# Patient Record
Sex: Female | Born: 1970 | Race: White | Hispanic: No | Smoking: Never smoker
Health system: Southern US, Community
[De-identification: ages and names within clinical notes are randomized; demographics above are authoritative.]

## PROBLEM LIST (undated history)

## (undated) DIAGNOSIS — J189 Pneumonia, unspecified organism: Secondary | ICD-10-CM

## (undated) DIAGNOSIS — Z8709 Personal history of other diseases of the respiratory system: Secondary | ICD-10-CM

## (undated) DIAGNOSIS — T8859XA Other complications of anesthesia, initial encounter: Secondary | ICD-10-CM

## (undated) DIAGNOSIS — D219 Benign neoplasm of connective and other soft tissue, unspecified: Secondary | ICD-10-CM

## (undated) DIAGNOSIS — C801 Malignant (primary) neoplasm, unspecified: Secondary | ICD-10-CM

## (undated) HISTORY — DX: Malignant (primary) neoplasm, unspecified: C80.1

## (undated) HISTORY — PX: WISDOM TOOTH EXTRACTION: SHX21

## (undated) HISTORY — DX: Other complications of anesthesia, initial encounter: T88.59XA

## (undated) HISTORY — PX: OTHER SURGICAL HISTORY: SHX169

---

## 2001-02-06 ENCOUNTER — Encounter: Payer: Self-pay | Admitting: Obstetrics and Gynecology

## 2001-02-06 ENCOUNTER — Ambulatory Visit (HOSPITAL_COMMUNITY): Admission: RE | Admit: 2001-02-06 | Discharge: 2001-02-06 | Payer: Self-pay | Admitting: Obstetrics and Gynecology

## 2001-09-23 ENCOUNTER — Inpatient Hospital Stay (HOSPITAL_COMMUNITY): Admission: AD | Admit: 2001-09-23 | Discharge: 2001-09-26 | Payer: Self-pay | Admitting: Obstetrics and Gynecology

## 2001-10-03 ENCOUNTER — Encounter: Admission: RE | Admit: 2001-10-03 | Discharge: 2001-11-02 | Payer: Self-pay | Admitting: Obstetrics and Gynecology

## 2002-01-22 ENCOUNTER — Other Ambulatory Visit: Admission: RE | Admit: 2002-01-22 | Discharge: 2002-01-22 | Payer: Self-pay | Admitting: Obstetrics and Gynecology

## 2003-07-30 ENCOUNTER — Other Ambulatory Visit: Admission: RE | Admit: 2003-07-30 | Discharge: 2003-07-30 | Payer: Self-pay | Admitting: Obstetrics and Gynecology

## 2004-07-17 ENCOUNTER — Inpatient Hospital Stay (HOSPITAL_COMMUNITY): Admission: RE | Admit: 2004-07-17 | Discharge: 2004-07-21 | Payer: Self-pay | Admitting: Obstetrics and Gynecology

## 2004-07-26 ENCOUNTER — Encounter: Admission: RE | Admit: 2004-07-26 | Discharge: 2004-08-25 | Payer: Self-pay | Admitting: Obstetrics and Gynecology

## 2004-08-26 ENCOUNTER — Encounter: Admission: RE | Admit: 2004-08-26 | Discharge: 2004-09-25 | Payer: Self-pay | Admitting: Obstetrics and Gynecology

## 2004-09-08 ENCOUNTER — Other Ambulatory Visit: Admission: RE | Admit: 2004-09-08 | Discharge: 2004-09-08 | Payer: Self-pay | Admitting: Obstetrics and Gynecology

## 2004-09-26 ENCOUNTER — Encounter: Admission: RE | Admit: 2004-09-26 | Discharge: 2004-10-25 | Payer: Self-pay | Admitting: Obstetrics and Gynecology

## 2004-10-26 ENCOUNTER — Encounter: Admission: RE | Admit: 2004-10-26 | Discharge: 2004-11-25 | Payer: Self-pay | Admitting: Obstetrics and Gynecology

## 2004-11-26 ENCOUNTER — Encounter: Admission: RE | Admit: 2004-11-26 | Discharge: 2004-12-25 | Payer: Self-pay | Admitting: Obstetrics and Gynecology

## 2004-12-26 ENCOUNTER — Encounter: Admission: RE | Admit: 2004-12-26 | Discharge: 2005-01-25 | Payer: Self-pay | Admitting: Obstetrics and Gynecology

## 2010-04-26 ENCOUNTER — Other Ambulatory Visit (HOSPITAL_COMMUNITY)
Admission: RE | Admit: 2010-04-26 | Discharge: 2010-04-26 | Disposition: A | Discharge status: Home / Self Care | Payer: BC Managed Care – PPO | Source: Ambulatory Visit | Attending: Obstetrics and Gynecology | Admitting: Obstetrics and Gynecology

## 2010-04-26 ENCOUNTER — Other Ambulatory Visit: Payer: Self-pay | Admitting: Obstetrics and Gynecology

## 2010-04-26 ENCOUNTER — Other Ambulatory Visit: Payer: Self-pay | Admitting: Nurse Practitioner

## 2010-04-26 DIAGNOSIS — N644 Mastodynia: Secondary | ICD-10-CM

## 2010-04-26 DIAGNOSIS — Z1159 Encounter for screening for other viral diseases: Secondary | ICD-10-CM | POA: Insufficient documentation

## 2010-04-26 DIAGNOSIS — Z01419 Encounter for gynecological examination (general) (routine) without abnormal findings: Secondary | ICD-10-CM | POA: Insufficient documentation

## 2011-05-01 ENCOUNTER — Encounter: Payer: BC Managed Care – PPO | Admitting: Vascular Surgery

## 2011-07-23 ENCOUNTER — Other Ambulatory Visit: Payer: Self-pay | Admitting: Obstetrics and Gynecology

## 2011-07-23 ENCOUNTER — Other Ambulatory Visit (HOSPITAL_COMMUNITY)
Admission: RE | Admit: 2011-07-23 | Discharge: 2011-07-23 | Disposition: A | Discharge status: Home / Self Care | Payer: No Typology Code available for payment source | Source: Ambulatory Visit | Attending: Obstetrics and Gynecology | Admitting: Obstetrics and Gynecology

## 2011-07-23 DIAGNOSIS — Z01419 Encounter for gynecological examination (general) (routine) without abnormal findings: Secondary | ICD-10-CM | POA: Insufficient documentation

## 2012-12-16 ENCOUNTER — Other Ambulatory Visit: Payer: Self-pay

## 2012-12-16 DIAGNOSIS — Z1231 Encounter for screening mammogram for malignant neoplasm of breast: Secondary | ICD-10-CM

## 2013-01-23 ENCOUNTER — Ambulatory Visit: Payer: No Typology Code available for payment source

## 2013-02-20 ENCOUNTER — Ambulatory Visit: Payer: No Typology Code available for payment source

## 2013-10-30 ENCOUNTER — Ambulatory Visit: Payer: No Typology Code available for payment source

## 2013-11-13 ENCOUNTER — Ambulatory Visit
Admission: RE | Admit: 2013-11-13 | Discharge: 2013-11-13 | Disposition: A | Discharge status: Home / Self Care | Payer: BC Managed Care – PPO | Source: Ambulatory Visit

## 2013-11-13 DIAGNOSIS — Z1231 Encounter for screening mammogram for malignant neoplasm of breast: Secondary | ICD-10-CM

## 2014-04-01 ENCOUNTER — Other Ambulatory Visit: Payer: Self-pay

## 2014-05-25 ENCOUNTER — Other Ambulatory Visit: Payer: Self-pay | Admitting: Nurse Practitioner

## 2014-05-25 ENCOUNTER — Other Ambulatory Visit (HOSPITAL_COMMUNITY)
Admission: RE | Admit: 2014-05-25 | Discharge: 2014-05-25 | Disposition: A | Discharge status: Home / Self Care | Payer: BLUE CROSS/BLUE SHIELD | Source: Ambulatory Visit | Attending: Nurse Practitioner | Admitting: Nurse Practitioner

## 2014-05-25 DIAGNOSIS — Z1151 Encounter for screening for human papillomavirus (HPV): Secondary | ICD-10-CM | POA: Insufficient documentation

## 2014-05-25 DIAGNOSIS — Z01419 Encounter for gynecological examination (general) (routine) without abnormal findings: Secondary | ICD-10-CM | POA: Diagnosis present

## 2014-05-26 LAB — CYTOLOGY - PAP

## 2014-11-19 ENCOUNTER — Other Ambulatory Visit: Payer: Self-pay

## 2014-11-19 DIAGNOSIS — Z1231 Encounter for screening mammogram for malignant neoplasm of breast: Secondary | ICD-10-CM

## 2014-12-24 ENCOUNTER — Ambulatory Visit: Payer: BLUE CROSS/BLUE SHIELD

## 2015-01-21 ENCOUNTER — Ambulatory Visit
Admission: RE | Admit: 2015-01-21 | Discharge: 2015-01-21 | Disposition: A | Discharge status: Home / Self Care | Payer: BLUE CROSS/BLUE SHIELD | Source: Ambulatory Visit

## 2015-01-21 DIAGNOSIS — Z1231 Encounter for screening mammogram for malignant neoplasm of breast: Secondary | ICD-10-CM

## 2016-03-26 ENCOUNTER — Other Ambulatory Visit: Payer: Self-pay | Admitting: Obstetrics and Gynecology

## 2016-03-26 DIAGNOSIS — Z1231 Encounter for screening mammogram for malignant neoplasm of breast: Secondary | ICD-10-CM

## 2016-04-27 ENCOUNTER — Other Ambulatory Visit: Payer: Self-pay | Admitting: Obstetrics and Gynecology

## 2016-04-27 ENCOUNTER — Ambulatory Visit
Admission: RE | Admit: 2016-04-27 | Discharge: 2016-04-27 | Disposition: A | Discharge status: Home / Self Care | Payer: BLUE CROSS/BLUE SHIELD | Source: Ambulatory Visit | Attending: Obstetrics and Gynecology | Admitting: Obstetrics and Gynecology

## 2016-04-27 DIAGNOSIS — Z1231 Encounter for screening mammogram for malignant neoplasm of breast: Secondary | ICD-10-CM

## 2017-09-27 ENCOUNTER — Other Ambulatory Visit: Payer: Self-pay | Admitting: Obstetrics and Gynecology

## 2017-09-27 ENCOUNTER — Other Ambulatory Visit (HOSPITAL_COMMUNITY)
Admission: RE | Admit: 2017-09-27 | Discharge: 2017-09-27 | Disposition: A | Discharge status: Home / Self Care | Payer: 59 | Source: Ambulatory Visit | Attending: Obstetrics and Gynecology | Admitting: Obstetrics and Gynecology

## 2017-09-27 DIAGNOSIS — Z01411 Encounter for gynecological examination (general) (routine) with abnormal findings: Secondary | ICD-10-CM | POA: Diagnosis present

## 2017-10-01 LAB — CYTOLOGY - PAP
Diagnosis: NEGATIVE
HPV: NOT DETECTED

## 2019-02-04 ENCOUNTER — Other Ambulatory Visit: Payer: Self-pay | Admitting: Obstetrics and Gynecology

## 2019-02-04 DIAGNOSIS — Z1231 Encounter for screening mammogram for malignant neoplasm of breast: Secondary | ICD-10-CM

## 2019-03-12 ENCOUNTER — Ambulatory Visit: Payer: 59

## 2019-04-13 ENCOUNTER — Ambulatory Visit: Payer: 59

## 2019-04-24 ENCOUNTER — Ambulatory Visit: Payer: 59

## 2019-04-27 ENCOUNTER — Other Ambulatory Visit: Payer: Self-pay

## 2019-04-27 ENCOUNTER — Ambulatory Visit
Admission: RE | Admit: 2019-04-27 | Discharge: 2019-04-27 | Disposition: A | Discharge status: Home / Self Care | Payer: 59 | Source: Ambulatory Visit | Attending: Obstetrics and Gynecology | Admitting: Obstetrics and Gynecology

## 2019-04-27 DIAGNOSIS — Z1231 Encounter for screening mammogram for malignant neoplasm of breast: Secondary | ICD-10-CM

## 2020-01-09 DIAGNOSIS — Z8616 Personal history of COVID-19: Secondary | ICD-10-CM

## 2020-01-09 HISTORY — DX: Personal history of COVID-19: Z86.16

## 2020-03-22 ENCOUNTER — Ambulatory Visit (HOSPITAL_BASED_OUTPATIENT_CLINIC_OR_DEPARTMENT_OTHER): Admission: RE | Admit: 2020-03-22 | Payer: 59 | Source: Home / Self Care | Admitting: Obstetrics and Gynecology

## 2020-03-22 ENCOUNTER — Encounter (HOSPITAL_BASED_OUTPATIENT_CLINIC_OR_DEPARTMENT_OTHER): Admission: RE | Payer: Self-pay | Source: Home / Self Care

## 2020-03-22 SURGERY — XI ROBOTIC ASSISTED LAPAROSCOPIC HYSTERECTOMY AND SALPINGECTOMY
Anesthesia: Choice | Laterality: Bilateral

## 2020-11-14 ENCOUNTER — Encounter (HOSPITAL_BASED_OUTPATIENT_CLINIC_OR_DEPARTMENT_OTHER): Payer: Self-pay | Admitting: Obstetrics and Gynecology

## 2020-11-14 ENCOUNTER — Other Ambulatory Visit: Payer: Self-pay

## 2020-11-14 NOTE — Progress Notes (Signed)
Spoke w/ via phone for pre-op interview---pt  Lab needs dos----  urine poct             Lab results------surgery orders req 11-10-2020 with kelly COVID test -----11-18-2020 extended recovery Arrive at -------530. Am 11-22-2020 NPO after MN NO Solid Food.  Clear liquids from MN until---430 am Med rec completed Medications to take morning of surgery -----none Diabetic medication -----n/a Patient instructed no nail polish to be worn day of surgery Patient instructed to bring photo id and insurance card day of surgery Patient aware to have Driver (ride ) / caregiver    for 24 hours after surgery  daughter grace age 78 Patient Special Instructions -----pt given extended recovery instructions Pre-Op special Istructions -----none Patient verbalized understanding of instructions that were given at this phone interview. Patient denies shortness of breath, chest pain, fever, cough at this phone interview.

## 2020-11-14 NOTE — Progress Notes (Signed)
YOU ARE SCHEDULED FOR A COVID TEST ON    11-18-2020 . THIS TEST MUST BE DONE BEFORE SURGERY. GO TO  [facility] @ [place]   PHONE NUMBER [phone].   TURN LEFT AT THE SHIPPING AND RECEIVING SIGN AND LOOK FOR SMALL BLUE POP UP TENT UNDER BUILDING OVERHANG AT BACK OF BUILDING AND REMAIN IN YOUR CAR, THIS IS A DRIVE UP TEST.  AFTER YOUR COVID TEST , PLEASE WEAR A MASK OUT IN PUBLIC AND SOCIAL DISTANCE AND [place] YOUR HANDS FREQUENTLY. PLEASE ASK ALL YOUR CLOSE HOUSEHOLD CONTACT TO WEAR MASK OUT IN PUBLIC AND SOCIAL DISTANCE AND [place] HANDS FREQUENTLY ALSO.      Your procedure is scheduled on 11-22-2020  Report to [facility].   Call this number if you have problems the morning of surgery  [phone].   OUR ADDRESS IS [place].  WE ARE LOCATED IN [facility].  PLEASE BRING YOUR INSURANCE CARD AND PHOTO ID DAY OF SURGERY.  ONLY ONE PERSON ALLOWED IN FACILITY WAITING AREA.                                     REMEMBER:  DO NOT EAT FOOD, CANDY GUM OR MINTS  AFTER MIDNIGHT . CLEAR LIQUIDS FROM MIDNIGHT UNTIL 430 AM. NO CLEAR LIQUIDS AFTER 430 AM DAY OF SURGERY.   YOU MAY  BRUSH YOUR TEETH MORNING OF SURGERY AND RINSE YOUR MOUTH OUT, NO CHEWING GUM CANDY OR MINTS.    CLEAR LIQUID DIET   Foods Allowed                                                                     Foods Excluded  Coffee and tea, regular and decaf                             liquids that you cannot  Plain Jell-O any favor except red or purple                                           see through such as: Fruit ices (not with fruit pulp)                                     milk, soups, orange juice  Iced Popsicles                                    All solid food Carbonated beverages, regular and diet                                    Cranberry, grape and apple juices Sports drinks like Gatorade Lightly seasoned clear  broth or consume(fat  free) Sugar  Sample Menu Breakfast                                Lunch                                     Supper Cranberry juice                    Beef broth                            Chicken broth Jell-O                                     Grape juice                           Apple juice Coffee or tea                        Jell-O                                      Popsicle                                                Coffee or tea                        Coffee or tea  _____________________________________________________________________     TAKE THESE MEDICATIONS MORNING OF SURGERY WITH A SIP OF WATER:  NONE  ONE VISITOR IS ALLOWED IN WAITING ROOM ONLY DAY OF SURGERY.  YOU MAY HAVE ANOTHER PERSON SWITCH OUT WITH THE  1  VISITOR IN THE WAITING ROOM DAY OF SURGERY AND A MASK MUST BE WORN IN THE WAITING ROOM.    2 VISITORS  MAY VISIT IN THE EXTENDED RECOVERY ROOM UNTIL 800 PM ONLY 1 VISITOR AGE 22 AND OVER MAY SPEND THE NIGHT AND MUST BE IN EXTENDED RECOVERY ROOM NO LATER THAN 800 PM .   UP TO 2 CHILDREN AGE 98 TO 15 MAY ALSO VISIT IN EXTENDED RECOVERY ROOM ONLY UNTIL 800 PM AND MUST LEAVE BY 800 PM. ALL PERSONS VISITING IN EXTENDED RECOVERY ROOM MUST WEAR A MASK.                                    DO NOT WEAR JEWERLY, MAKE UP. DO NOT WEAR LOTIONS, POWDERS, PERFUMES OR NAIL POLISH ON YOUR FINGERNAILS. TOENAIL POLISH IS OK TO WEAR. DO NOT SHAVE FOR 48 HOURS PRIOR TO DAY OF SURGERY. MEN MAY SHAVE FACE AND NECK. CONTACTS, GLASSES, OR DENTURES MAY NOT BE WORN TO SURGERY.                                    [facility] IS NOT  RESPONSIBLE  FOR ANY BELONGINGS.                                                                    Marland Kitchen           [place] - Preparing for Surgery Before surgery, you can play an important role.  Because skin is not sterile, your skin needs to be as free of germs as possible.  You can reduce the number of germs on your skin by washing with  CHG (chlorahexidine gluconate) soap before surgery.  CHG is an antiseptic cleaner which kills germs and bonds with the skin to continue killing germs even after washing. Please DO NOT use if you have an allergy to CHG or antibacterial soaps.  If your skin becomes reddened/irritated stop using the CHG and inform your nurse when you arrive at Short Stay. Do not shave (including legs and underarms) for at least 48 hours prior to the first CHG shower.  You may shave your face/neck. Please follow these instructions carefully:  1.  Shower with CHG Soap the night before surgery and the  morning of Surgery.  2.  If you choose to wash your hair, wash your hair first as usual with your  normal  shampoo.  3.  After you shampoo, rinse your hair and body thoroughly to remove the  shampoo.                            4.  Use CHG as you would any other liquid soap.  You can apply chg directly  to the skin and wash                      Gently with a scrungie or clean washcloth.  5.  Apply the CHG Soap to your body ONLY FROM THE NECK DOWN.   Do not use on face/ open                           Wound or open sores. Avoid contact with eyes, ears mouth and genitals (private parts).                       Wash face,  Genitals (private parts) with your normal soap.             6.  Wash thoroughly, paying special attention to the area where your surgery  will be performed.  7.  Thoroughly rinse your body with warm water from the neck down.  8.  DO NOT shower/wash with your normal soap after using and rinsing off  the CHG Soap.                9.  Pat yourself dry with a clean towel.            10.  Wear clean pajamas.            11.  Place clean sheets on your bed the night of your first shower and do not  sleep with pets. Day of Surgery : Do not apply any lotions/deodorants the morning of surgery.  Please wear clean clothes  to the hospital/surgery center.  IF YOU HAVE ANY SKIN IRRITATION OR PROBLEMS WITH THE SURGICAL  SOAP, PLEASE GET A BAR OF GOLD DIAL SOAP AND SHOWER THE NIGHT BEFORE YOUR SURGERY AND THE MORNING OF YOUR SURGERY. PLEASE LET THE NURSE KNOW MORNING OF YOUR SURGERY IF YOU HAD ANY PROBLEMS WITH THE SURGICAL SOAP.  FAILURE TO FOLLOW THESE INSTRUCTIONS MAY RESULT IN THE CANCELLATION OF YOUR SURGERY PATIENT SIGNATURE_________________________________  NURSE SIGNATURE__________________________________  ________________________________________________________________________                                                        QUESTIONS Hansel Feinstein PRE OP NURSE PHONE [phone].

## 2020-11-18 ENCOUNTER — Encounter (HOSPITAL_COMMUNITY): Admission: RE | Admit: 2020-11-18 | Payer: 59 | Source: Ambulatory Visit

## 2020-11-22 ENCOUNTER — Ambulatory Visit (HOSPITAL_BASED_OUTPATIENT_CLINIC_OR_DEPARTMENT_OTHER)
Admission: RE | Admit: 2020-11-22 | Payer: Medicaid Other | Source: Ambulatory Visit | Admitting: Obstetrics and Gynecology

## 2020-11-22 DIAGNOSIS — Z01818 Encounter for other preprocedural examination: Secondary | ICD-10-CM

## 2020-11-22 HISTORY — DX: Benign neoplasm of connective and other soft tissue, unspecified: D21.9

## 2020-11-22 SURGERY — XI ROBOTIC ASSISTED LAPAROSCOPIC HYSTERECTOMY AND SALPINGECTOMY
Anesthesia: Choice | Laterality: Bilateral

## 2021-09-20 IMAGING — MG DIGITAL SCREENING BILAT W/ TOMO W/ CAD
8 series · 9 of 24 positions shown · non-contrast
Comparison: Previous exam(s).

CLINICAL DATA: Screening.

EXAM:
DIGITAL SCREENING BILATERAL MAMMOGRAM WITH TOMO AND CAD

[L MLO synth-2D]
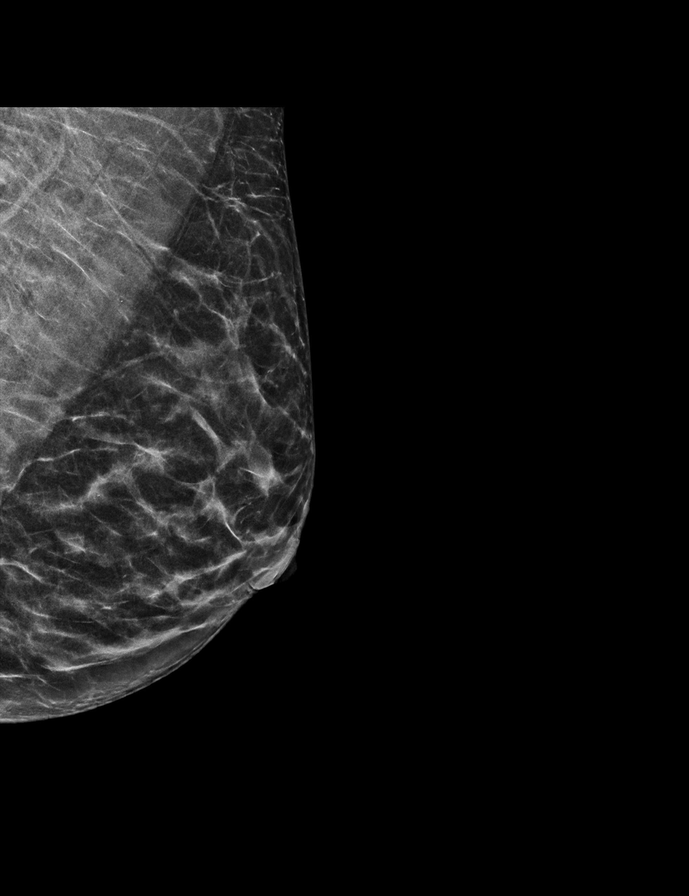

[R MLO synth-2D]
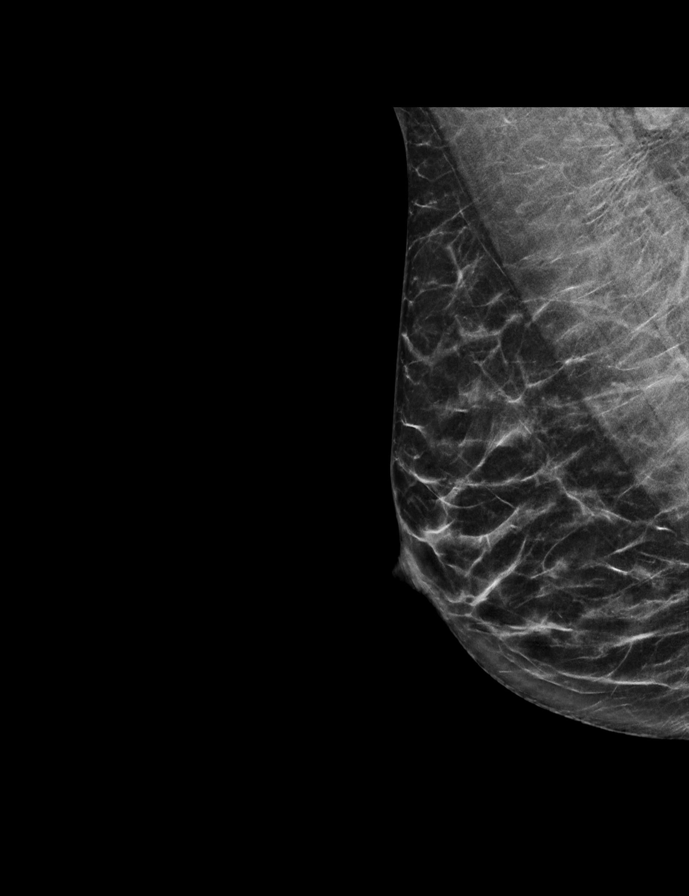

[L CC synth-2D]
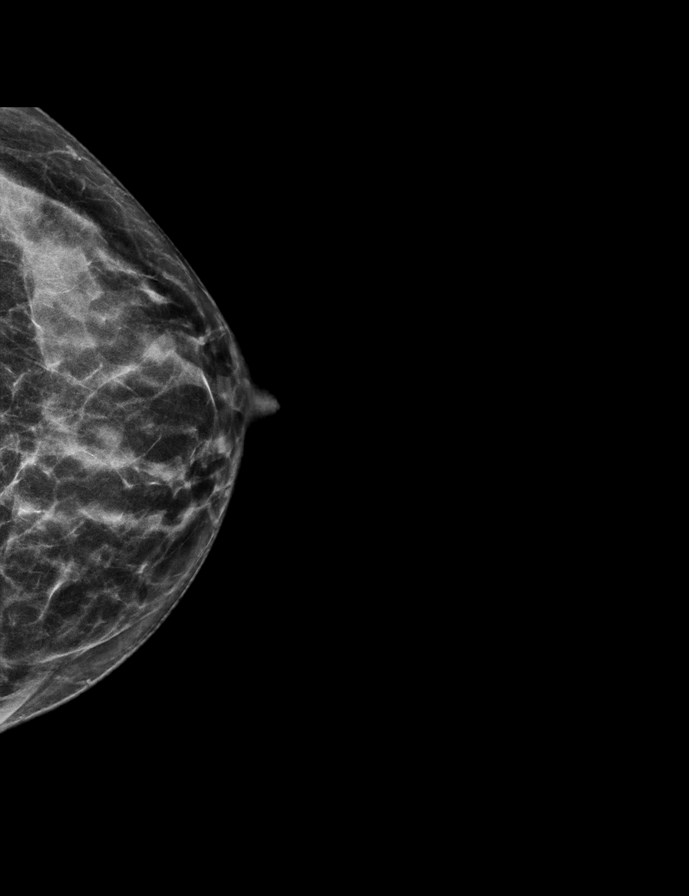

[R CC synth-2D]
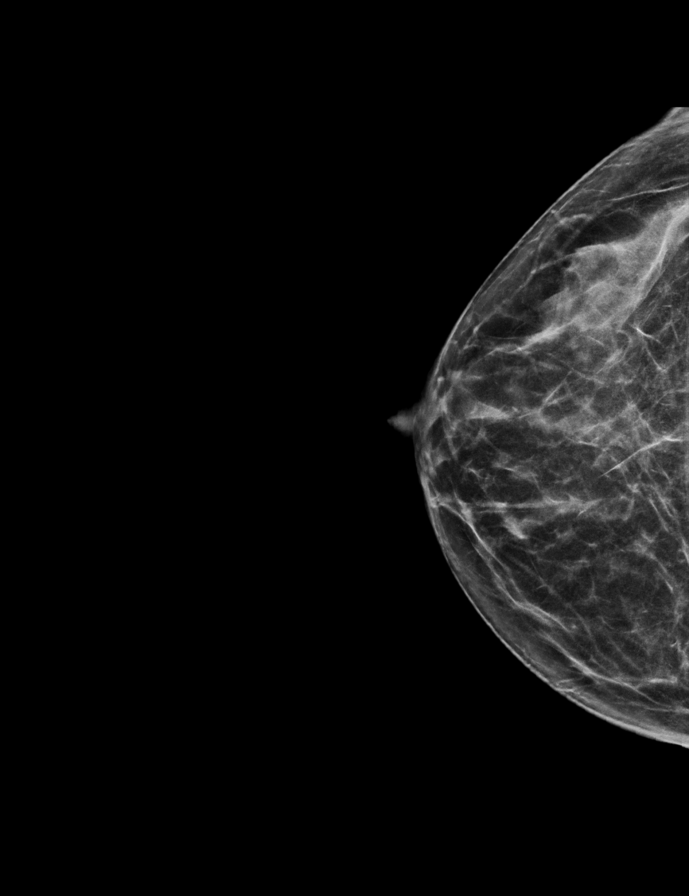

[R MLO tomo · 2 of 52 frames shown]
[frame 17/52]
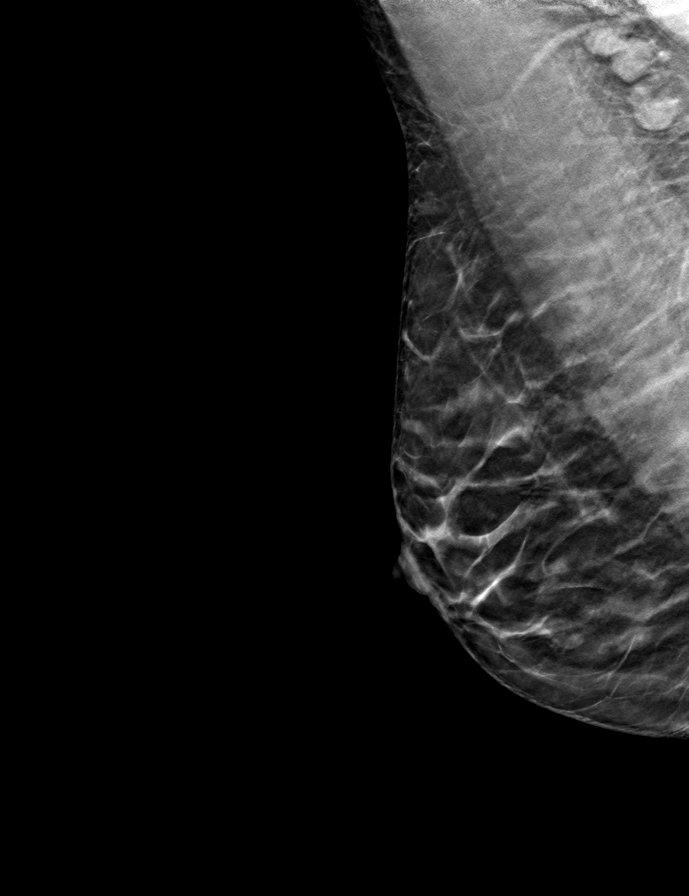
[frame 27/52]
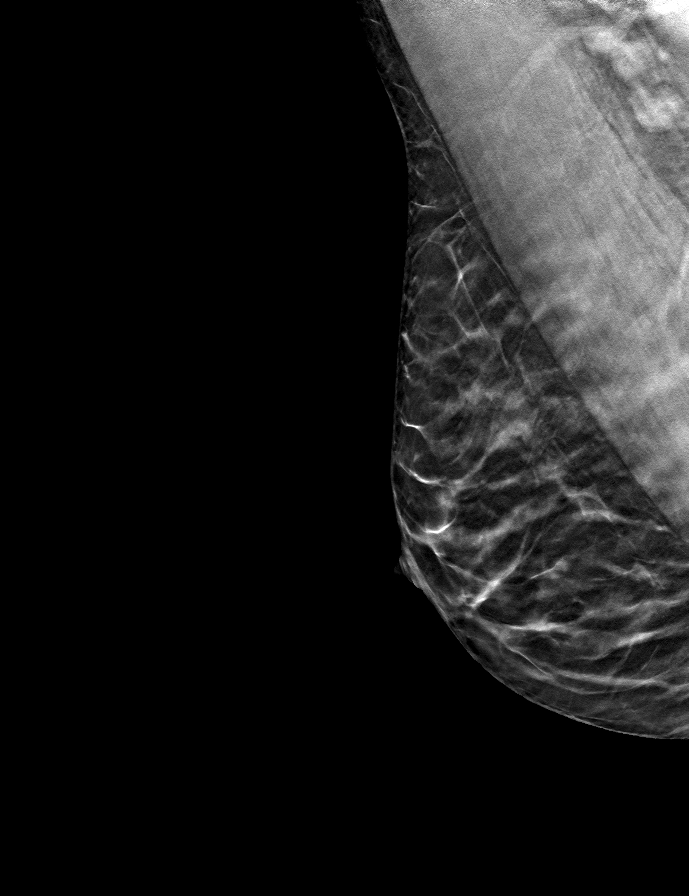

[L CC tomo · tomo slice 27/54.0]
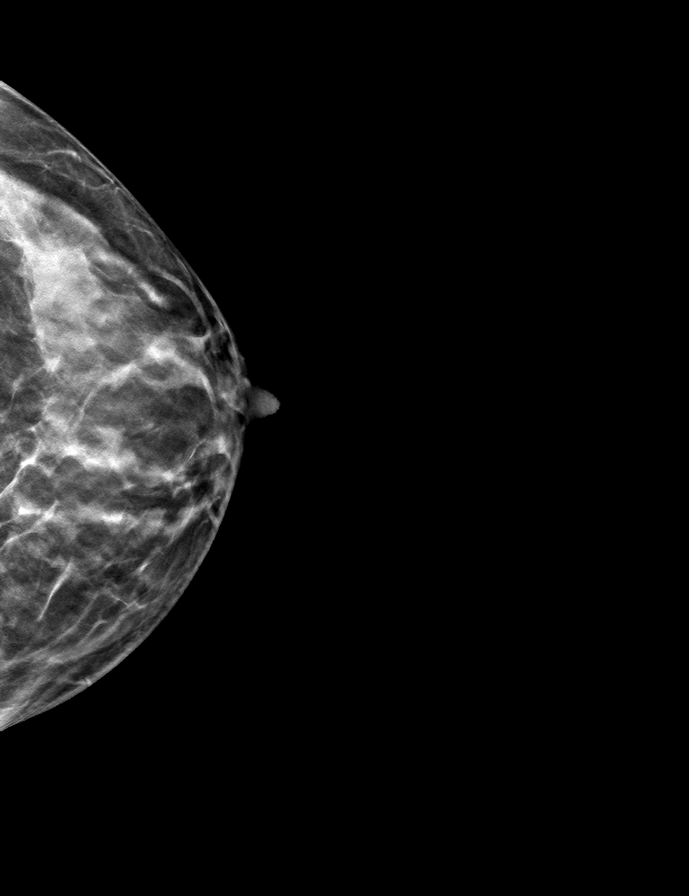

[L MLO tomo · tomo slice 26/51.0]
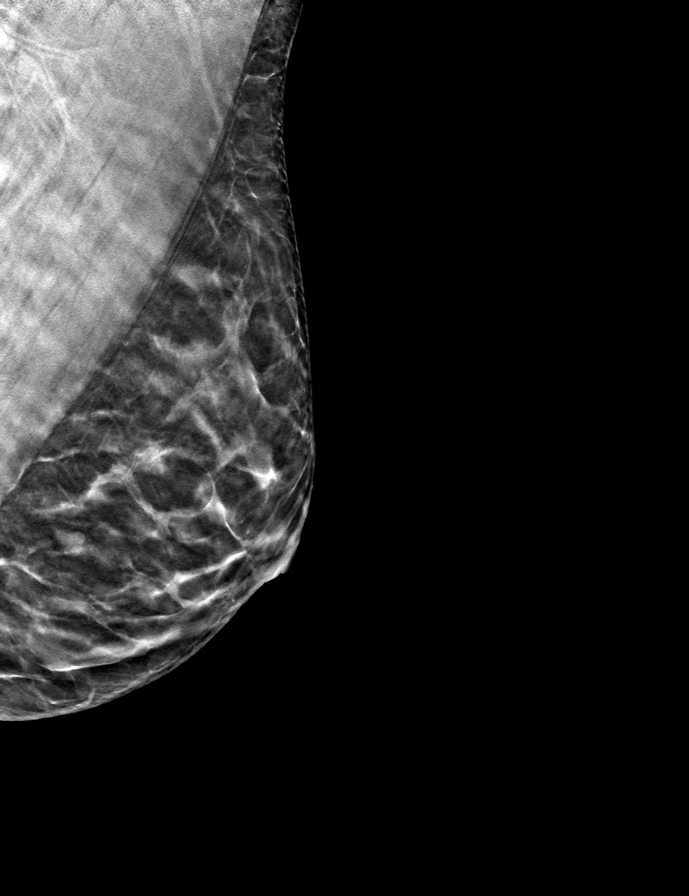

[R CC tomo · tomo slice 25/50.0]
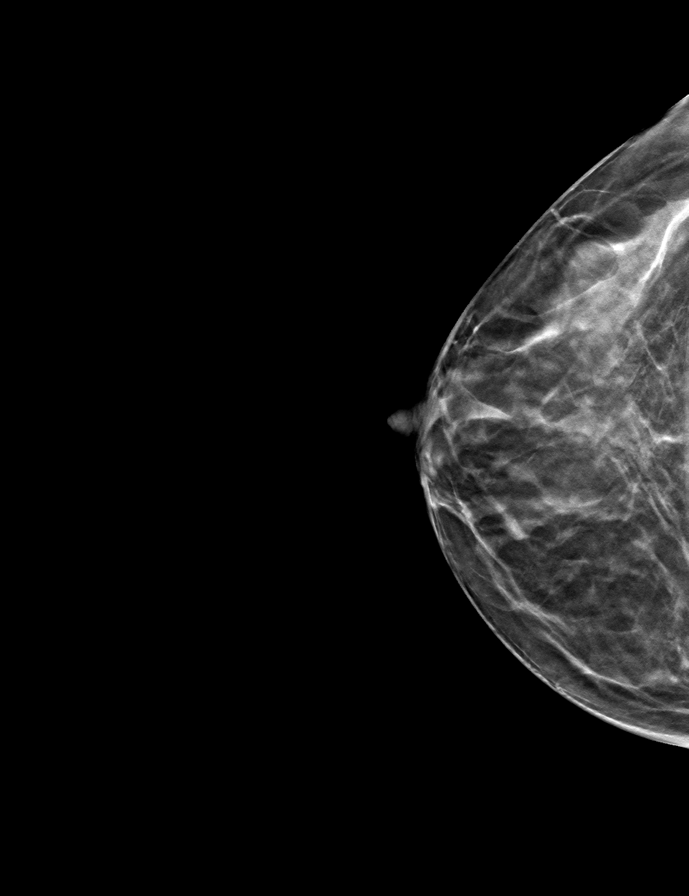

[9 of 24 positions shown; findings below may reference images not displayed]

ACR Breast Density Category c: The breast tissue is heterogeneously
dense, which may obscure small masses.
FINDINGS: There are no findings suspicious for malignancy. Images were
processed with CAD.
IMPRESSION: No mammographic evidence of malignancy. A result letter of this
screening mammogram will be mailed directly to the patient.

RECOMMENDATION:
Screening mammogram in one year. (Code:FT-[id number])

BI-RADS CATEGORY  1: Negative.

## 2022-10-22 ENCOUNTER — Encounter: Payer: 59 | Admitting: Obstetrics & Gynecology

## 2022-10-22 ENCOUNTER — Telehealth: Payer: Self-pay | Admitting: *Deleted

## 2022-10-22 NOTE — Telephone Encounter (Signed)
Left patient a detailed message with all appointment information.

## 2022-10-24 ENCOUNTER — Ambulatory Visit: Payer: No Typology Code available for payment source | Admitting: Obstetrics & Gynecology

## 2022-10-24 ENCOUNTER — Ambulatory Visit: Payer: No Typology Code available for payment source

## 2022-10-24 ENCOUNTER — Encounter: Payer: Self-pay | Admitting: Obstetrics & Gynecology

## 2022-10-24 VITALS — BP 138/89 | HR 73 | Wt 173.0 lb

## 2022-10-24 DIAGNOSIS — D219 Benign neoplasm of connective and other soft tissue, unspecified: Secondary | ICD-10-CM | POA: Diagnosis not present

## 2022-10-24 DIAGNOSIS — N920 Excessive and frequent menstruation with regular cycle: Secondary | ICD-10-CM | POA: Diagnosis not present

## 2022-10-24 DIAGNOSIS — D259 Leiomyoma of uterus, unspecified: Secondary | ICD-10-CM

## 2022-10-24 DIAGNOSIS — T8332XA Displacement of intrauterine contraceptive device, initial encounter: Secondary | ICD-10-CM | POA: Diagnosis not present

## 2022-10-24 DIAGNOSIS — Z01419 Encounter for gynecological examination (general) (routine) without abnormal findings: Secondary | ICD-10-CM | POA: Diagnosis not present

## 2022-10-24 NOTE — Progress Notes (Signed)
Subjective:     Frances Perez is a 52 y.o. female here for a routine exam.  Current complaints: havy menstrual cycles--6 days; changes every 2 hours for 2 days; mild cramping. +fibroids.  Pt was scheduled to have hysterectomy with [facility] back in 11-23-20.  Husband passed away and was going through an emotionally difficult time--her physicians advised her to put off major surgery.  She now presents to [place] for care.     Gynecologic History Patient's last menstrual period was 10/09/2022. Contraception: abstinence and IUD Last Pap: 11-23-2020 per patient. Results were: normal Last mammogram: November 24, 2019. Results were: normal  Obstetric History OB History  Gravida Para Term Preterm AB Living  2 2 2     2   SAB IAB Ectopic Multiple Live Births               # Outcome Date GA Lbr Len/2nd Weight Sex Type Anes PTL Lv  2 Term           1 Term              The following portions of the patient's history were reviewed and updated as appropriate: allergies, current medications, past family history, past medical history, past social history, past surgical history, and problem list.  Review of Systems Pertinent items noted in HPI and remainder of comprehensive ROS otherwise negative.    Objective:     Vitals:   10/24/22 0845 10/24/22 0856  BP: (!) 143/100 138/89  Pulse: 69 73  Weight: 173 lb (78.5 kg)    Vitals:  WNL General appearance: alert, cooperative and no distress  HEENT: Normocephalic, without obvious abnormality, atraumatic Eyes: negative Throat: lips, mucosa, and tongue normal; teeth and gums normal  Respiratory: Clear to auscultation bilaterally  CV: Regular rate and rhythm  Breasts:  Normal appearance, no masses or tenderness, no nipple retraction or dimpling  GI: Soft, non-tender; bowel sounds normal; no masses,  no organomegaly  GU: External Genitalia:  Tanner V, no lesion Urethra:  No prolapse   Vagina: Pink, normal rugae, no blood or discharge  Cervix: No CMT, no lesion, IUD string  tip barely felt--no visible  Uterus:    Adnexa:   Musculoskeletal: No edema, redness or tenderness in the calves or thighs  Skin: No lesions or rash  Lymphatic: Axillary adenopathy: none     Psychiatric: Normal mood and behavior       Assessment:    Healthy female exam.    Plan:    Record release from [facility]--paps, [place] and notes It has been 2 years since last [place], will repeat Colonoscopy referral IUD string tip can be felt--tools available today are not able to grasp strings Will review [place] and discuss plan at next visit.  Hysterectomy vs ablation.

## 2022-10-24 NOTE — Addendum Note (Signed)
Addended by: Lesly Dukes on: 10/24/2022 02:33 PM   Modules accepted: Orders

## 2022-11-01 ENCOUNTER — Encounter (HOSPITAL_BASED_OUTPATIENT_CLINIC_OR_DEPARTMENT_OTHER): Payer: Self-pay

## 2022-11-01 ENCOUNTER — Inpatient Hospital Stay (HOSPITAL_BASED_OUTPATIENT_CLINIC_OR_DEPARTMENT_OTHER): Admission: RE | Admit: 2022-11-01 | Payer: No Typology Code available for payment source | Source: Ambulatory Visit

## 2022-11-01 ENCOUNTER — Ambulatory Visit (HOSPITAL_BASED_OUTPATIENT_CLINIC_OR_DEPARTMENT_OTHER)
Admission: RE | Admit: 2022-11-01 | Discharge: 2022-11-01 | Disposition: A | Discharge status: Home / Self Care | Payer: No Typology Code available for payment source | Source: Ambulatory Visit | Attending: Obstetrics & Gynecology | Admitting: Obstetrics & Gynecology

## 2022-11-01 ENCOUNTER — Ambulatory Visit (HOSPITAL_BASED_OUTPATIENT_CLINIC_OR_DEPARTMENT_OTHER): Payer: No Typology Code available for payment source

## 2022-11-01 DIAGNOSIS — Z1231 Encounter for screening mammogram for malignant neoplasm of breast: Secondary | ICD-10-CM | POA: Diagnosis present

## 2022-11-01 DIAGNOSIS — Z01419 Encounter for gynecological examination (general) (routine) without abnormal findings: Secondary | ICD-10-CM

## 2022-11-14 ENCOUNTER — Telehealth: Payer: Self-pay | Admitting: *Deleted

## 2022-11-14 NOTE — Telephone Encounter (Signed)
Returned call from 9:42 AM. Left patient a message with updated appointment information.

## 2022-11-15 ENCOUNTER — Encounter: Payer: Self-pay | Admitting: Obstetrics and Gynecology

## 2022-11-15 ENCOUNTER — Other Ambulatory Visit (HOSPITAL_COMMUNITY)
Admission: RE | Admit: 2022-11-15 | Discharge: 2022-11-15 | Disposition: A | Discharge status: Home / Self Care | Payer: No Typology Code available for payment source | Source: Ambulatory Visit | Attending: Obstetrics and Gynecology | Admitting: Obstetrics and Gynecology

## 2022-11-15 ENCOUNTER — Ambulatory Visit: Payer: No Typology Code available for payment source | Admitting: Obstetrics and Gynecology

## 2022-11-15 VITALS — BP 130/67 | HR 81 | Ht 68.0 in | Wt 170.0 lb

## 2022-11-15 DIAGNOSIS — N939 Abnormal uterine and vaginal bleeding, unspecified: Secondary | ICD-10-CM | POA: Diagnosis present

## 2022-11-15 DIAGNOSIS — D219 Benign neoplasm of connective and other soft tissue, unspecified: Secondary | ICD-10-CM | POA: Insufficient documentation

## 2022-11-15 DIAGNOSIS — N83201 Unspecified ovarian cyst, right side: Secondary | ICD-10-CM | POA: Insufficient documentation

## 2022-11-15 DIAGNOSIS — D259 Leiomyoma of uterus, unspecified: Secondary | ICD-10-CM

## 2022-11-15 DIAGNOSIS — Z30432 Encounter for removal of intrauterine contraceptive device: Secondary | ICD-10-CM

## 2022-11-15 MED ORDER — TRANEXAMIC ACID 650 MG PO TABS: 1300.0000 mg | ORAL_TABLET | Freq: Three times a day (TID) | ORAL | 2 refills | Status: DC

## 2022-11-15 NOTE — Progress Notes (Signed)
GYNECOLOGY OFFICE VISIT NOTE  History:   Frances Perez is a 52 y.o. [id number] here today for discussion for surgical options for AUB/heavy bleeding.   She currently has an IUD but would like removed.   Discussed the use of AI scribe software for clinical note transcription with the patient, who gave verbal consent to proceed.  History of Present Illness   The patient, with a known history of uterine fibroids, presents with a chief complaint of heavy and prolonged menstrual bleeding. The patient reports that her periods have been significantly heavy and have lasted longer than usual. She also mentions intermittent bleeding even after her period has ended. She has been dealing with these symptoms for some time and has previously tried an intrauterine device (IUD) for management, which did not seem to alleviate her symptoms. The patient had been considering a hysterectomy before, but due to life circumstances, she had to put that on hold. The patient expresses a strong desire to manage her heavy bleeding and is open to discussing various treatment options.     Reviewed Dr. Bertram Denver note from 10/16: Current complaints: havy menstrual cycles--6 days; changes every 2 hours for 2 days; mild cramping. +fibroids.  Pt was scheduled to have hysterectomy with [facility] back in 2020-12-03.  Husband passed away and was going through an emotionally difficult time--her physicians advised her to put off major surgery.  She now presents to [place] for care.     10/16 [place] reviewed report and images. Report: Measurements: 11.3 x 7.0 x 10.6 cm = volume: 440 mL. Central fibroid measures up to 5.4 cm. Submucosal lower uterine segment fibroid measures up to 3.0 cm. Right fundal fibroid measures up to 5.4 cm. Right complex cyst due to internal septation - total size is 4.6 x2.8x2.1.   I reviewed the images - right ovarian cyst is small and thin septation. IUD is in cervix/LUS. Fibroids are as described.   She has a history of 2  prior c-sections.    Past Surgical History:  Procedure Laterality Date   c sections     12/03/2001 and 2004-12-03    The following portions of the patient's history were reviewed and updated as appropriate: allergies, current medications, past family history, past medical history, past social history, past surgical history and problem list.   Health Maintenance:   Normal pap and negative HRHPV:    Diagnosis  Date Value Ref Range Status  09/27/2017   Final   NEGATIVE FOR INTRAEPITHELIAL LESIONS OR MALIGNANCY.  09/27/2017   Final   A LETTER WAS SENT TO THE PATIENT INFORMING HER OF THE ABOVE RESULTS.   Pt reports last pap was in 03-Dec-2020 with [facility]. [facility] contacted - no pap done in 12-03-2020.   Review of Systems:  Pertinent items noted in HPI and remainder of comprehensive ROS otherwise negative.  Physical Exam:  BP 130/67   Pulse 81   Ht 5\' 8"  (1.727 m)   Wt 170 lb (77.1 kg)   LMP 10/09/2022   BMI 25.85 kg/m  CONSTITUTIONAL: Well-developed, well-nourished female in no acute distress.  HEENT:  Normocephalic, atraumatic. External right and left ear normal. No scleral icterus.  NECK: Normal range of motion, supple, no masses noted on observation SKIN: No rash noted. Not diaphoretic. No erythema. No pallor. MUSCULOSKELETAL: Normal range of motion. No edema noted. NEUROLOGIC: Alert and oriented to person, place, and time. Normal muscle tone coordination. No cranial nerve deficit noted. PSYCHIATRIC: Normal mood and affect. Normal behavior. Normal judgment  and thought content.  PELVIC: Normal appearing external genitalia; normal urethral meatus; normal appearing vaginal mucosa and cervix.  No abnormal discharge noted.  Normal uterine size, no other palpable masses, no uterine or adnexal tenderness. Performed in the presence of a chaperone  Labs and Imaging No results found for this or any previous visit (from the past 168 hour(s)). US PELVIC COMPLETE WITH TRANSVAGINAL  Result Date:  11/07/2022 CLINICAL DATA:  Fibroids, abnormal uterine bleeding.  Check IUD. EXAM: TRANSABDOMINAL AND TRANSVAGINAL ULTRASOUND OF PELVIS TECHNIQUE: Both transabdominal and transvaginal ultrasound examinations of the pelvis were performed. Transabdominal technique was performed for global imaging of the pelvis including uterus, ovaries, adnexal regions, and pelvic cul-de-sac. It was necessary to proceed with endovaginal exam following the transabdominal exam to visualize the uterus, endometrium, ovaries and adnexa. COMPARISON:  None Available. FINDINGS: Uterus Measurements: 11.3 x 7.0 x 10.6 cm = volume: 440 mL. Central fibroid measures up to 5.4 cm. Submucosal lower uterine segment fibroid measures up to 3.0 cm. Right fundal fibroid measures up to 5.4 cm. Endometrium Thickness: 8 mm in thickness.  No focal abnormality visualized. Right ovary Measurements: 5.6 x 2.5 x 3.6 cm = volume: 26 mL. Complex cyst with internal septation measures 4.6 x 2.8 x 2.1 cm. Left ovary Measurements: 3.1 x 0.8 x 2.4 cm = volume: 3.3 mL. Normal appearance/no adnexal mass. Other findings No abnormal free fluid. IMPRESSION: Enlarged fibroid uterus as described above. 4.6 cm complex cyst in the right ovary. This could be followed with repeat ultrasound in 3-6 months to ensure resolution. Electronically Signed   By: Charlett Nose M.D.   On: 11/07/2022 17:44   MM 3D SCREENING MAMMOGRAM BILATERAL BREAST  Result Date: 11/05/2022 CLINICAL DATA:  Screening. EXAM: DIGITAL SCREENING BILATERAL MAMMOGRAM WITH TOMOSYNTHESIS AND CAD TECHNIQUE: Bilateral screening digital craniocaudal and mediolateral oblique mammograms were obtained. Bilateral screening digital breast tomosynthesis was performed. The images were evaluated with computer-aided detection. COMPARISON:  Previous exam(s). ACR Breast Density Category c: The breasts are heterogeneously dense, which may obscure small masses. FINDINGS: There are no findings suspicious for malignancy.  IMPRESSION: No mammographic evidence of malignancy. A result letter of this screening mammogram will be mailed directly to the patient. RECOMMENDATION: Screening mammogram in one year. (Code:[id number]) BI-RADS CATEGORY  1: Negative. Electronically Signed   By: Frederico Hamman M.D.   On: 11/05/2022 14:57      GYNECOLOGY OFFICE PROCEDURE NOTE  AKANSHA WYCHE is a 52 y.o. [id number] here for IUD removal.   IUD Removal  Patient identified, informed consent performed, consent signed.  Patient was in the dorsal lithotomy position, normal external genitalia was noted.  A speculum was placed in the patient's vagina, normal discharge was noted, no lesions. The cervix was visualized, no lesions, no abnormal discharge.  The strings of the IUD were grasped and pulled using ring forceps. The IUD was removed in its entirety.   Patient tolerated the procedure well.    Assessment and Plan:  Frances Perez was seen today for follow-up.  Diagnoses and all orders for this visit:  Abnormal uterine bleeding (AUB) -     Cytology - PAP -     tranexamic acid (LYSTEDA) 650 MG TABS tablet; Take 2 tablets (1,300 mg total) by mouth 3 (three) times daily. Take during menses for a maximum of five days -     Ambulatory Referral For Surgery Scheduling  Encounter for IUD removal IUD located in lower uterine segment/cervix, likely due to fibroid interference, reducing its effectiveness in  controlling menstrual bleeding. - IUD removed   Fibroid -     Ambulatory Referral For Surgery Scheduling   Heavy and prolonged menstrual bleeding, likely due to multiple fibroids, including one inside the uterus. IUD not optimally placed due to fibroid interference, reducing its effectiveness in controlling bleeding. -Perform Pap smear today. -Prescribe tranexamic acid (Lysteda) to reduce menstrual bleeding. - We discussed all options available - expectant management, TXA, GNRH antagonists, hormonal birth control, hysteroscopic  myomectomy/sonata, [place], and hysterectomy. We discussed all the risks and benefits to each treatment option. We reviewed the recovery and limitations following each procedure. I discussed the risks of complications with surgery in detail.  -After answering all questions, she would like to do a procedure and would like to avoid hysterectomy if possible. Schedule for hysteroscopic myomectomy and Sonata procedure to remove and treat fibroids in January -Consider uterine artery embolization if necessary, after further discussion and MRI.  Right Ovarian Cyst Complex cyst noted on imaging, but not concerning for malignancy. -No immediate intervention required, monitor for changes.         Meds ordered this encounter  Medications   tranexamic acid (LYSTEDA) 650 MG TABS tablet    Sig: Take 2 tablets (1,300 mg total) by mouth 3 (three) times daily. Take during menses for a maximum of five days    Dispense:  30 tablet    Refill:  2     Routine preventative health maintenance measures emphasized. Please refer to After Visit Summary for other counseling recommendations.   No follow-ups on file.  Milas Hock, MD, FACOG Obstetrician & Gynecologist, [facility] for [facility], [facility]

## 2022-11-19 LAB — CYTOLOGY - PAP
Comment: NEGATIVE
Diagnosis: NEGATIVE
High risk HPV: NEGATIVE

## 2022-11-21 ENCOUNTER — Encounter: Payer: Self-pay | Admitting: *Deleted

## 2022-11-22 ENCOUNTER — Encounter: Payer: Self-pay | Admitting: Gastroenterology

## 2022-12-13 ENCOUNTER — Encounter: Payer: Self-pay | Admitting: Obstetrics and Gynecology

## 2022-12-18 ENCOUNTER — Telehealth: Payer: Self-pay | Admitting: *Deleted

## 2022-12-18 ENCOUNTER — Telehealth: Payer: Self-pay

## 2022-12-18 ENCOUNTER — Ambulatory Visit (AMBULATORY_SURGERY_CENTER): Payer: No Typology Code available for payment source | Admitting: *Deleted

## 2022-12-18 VITALS — Ht 68.0 in | Wt 165.0 lb

## 2022-12-18 DIAGNOSIS — Z1211 Encounter for screening for malignant neoplasm of colon: Secondary | ICD-10-CM

## 2022-12-18 MED ORDER — NA SULFATE-K SULFATE-MG SULF 17.5-3.13-1.6 GM/177ML PO SOLN: 1.0000 | Freq: Once | ORAL | 0 refills | Status: AC

## 2022-12-18 NOTE — Telephone Encounter (Signed)
Informed pt that we did go over everything in the PV call earlier. She stated she was in an area where she lost service. No other questions at this time.

## 2022-12-18 NOTE — Telephone Encounter (Signed)
PT returned call after losing service and wants to make sure everything was covered in the appointment. Please advise.

## 2022-12-18 NOTE — Telephone Encounter (Signed)
Called patient to schedule surgery w/ Dr. Para March. Patient is available on 01/16/23 and would like surgery performed at [facility] at 9:30 am. Provided pre-op instructions and surgery details by phone. Once surgery is scheduled written details will be sent to Mychart. Patient confirmed understanding.

## 2022-12-18 NOTE — Telephone Encounter (Signed)
Attempt to reach pt for pre-visit. LM with call back #. 2nd attempt reached pt

## 2022-12-18 NOTE — Progress Notes (Signed)
Pt's name and DOB verified at the beginning of the pre-visit wit 2 identifiers  Pt denies any difficulty with ambulating,sitting, laying down or rolling side to side  Pt has no issues with ambulation   Pt has no issues moving head neck or swallowing  No egg or soy allergy known to patient   No issues known to pt with past sedation with any surgeries or procedures  Patient denies ever being intubated  No FH of Malignant Hyperthermia  Pt is not on diet pills or shots  Pt is not on home 02   Pt is not on blood thinners   Pt denies issues with constipation   Pt is not on dialysis  Pt denise any abnormal heart rhythms   Pt denies any upcoming cardiac testing  Pt encouraged to use to use Singlecare or Goodrx to reduce cost   Patient's chart reviewed by Cathlyn Parsons CNRA prior to pre-visit and patient appropriate for the [facility].  Pre-visit completed and red dot placed by patient's name on their procedure day (on provider's schedule).  .  Visit by phone  Pt states weight is 165 lb  Instructed pt why it is important to and  to call if they have any changes in health or new medications. Directed them to the # given and on instructions.     Instructions reviewed. Pt given both [facility] main # and MD on call # prior to instructions.  Pt states understanding. Instructed to review again prior to procedure. Pt states they will.   Instructions sent by mail with coupon and by My Chart  Coupon sent via text to mobile phone and pt verified they received it

## 2023-01-08 ENCOUNTER — Encounter (HOSPITAL_COMMUNITY): Payer: Self-pay | Admitting: *Deleted

## 2023-01-08 NOTE — Progress Notes (Signed)
 Spoke w/ via phone for pre-op interview--- [facility] Lab needs dos---- CBC, T&S and UPT per surgeon.        Lab results------ COVID test -----patient states asymptomatic no test needed Arrive at -------0745 NPO after MN NO Solid Food.  Clear liquids from MN until---0645 Med rec completed Medications to take morning of surgery -----NONE Diabetic medication ----- Patient instructed no nail polish to be worn day of surgery Patient instructed to bring photo id and insurance card day of surgery Patient aware to have Driver (ride ) / caregiver    for 24 hours after surgery - Daughters Ronnald and Estefana Robert Patient Special Instructions ----- Pre-Op special Instructions ----- Patient verbalized understanding of instructions that were given at this phone interview. Patient denies chest pain, sob, fever, cough at the interview.

## 2023-01-10 ENCOUNTER — Encounter: Payer: Self-pay | Admitting: Obstetrics & Gynecology

## 2023-01-15 NOTE — Anesthesia Preprocedure Evaluation (Addendum)
 Anesthesia Evaluation  Patient identified by MRN, date of birth, ID band Patient awake    Reviewed: Allergy & Precautions, NPO status , Patient's Chart, lab work & pertinent test results  Airway Mallampati: II  TM Distance: >3 FB Neck ROM: Full    Dental no notable dental hx. (+) Teeth Intact, Dental Advisory Given   Pulmonary neg pulmonary ROS   Pulmonary exam normal breath sounds clear to auscultation       Cardiovascular negative cardio ROS Normal cardiovascular exam Rhythm:Regular Rate:Normal     Neuro/Psych negative neurological ROS  negative psych ROS   GI/Hepatic negative GI ROS, Neg liver ROS,,,  Endo/Other  negative endocrine ROS    Renal/GU negative Renal ROS     Musculoskeletal negative musculoskeletal ROS (+)    Abdominal   Peds  Hematology   Anesthesia Other Findings   Reproductive/Obstetrics negative OB ROS                             Anesthesia Physical Anesthesia Plan  ASA: 1  Anesthesia Plan: General   Post-op Pain Management: Precedex , Tylenol  PO (pre-op)* and Toradol  IV (intra-op)*   Induction: Intravenous  PONV Risk Score and Plan: 4 or greater and Treatment may vary due to age or medical condition, Midazolam  and Dexamethasone   Airway Management Planned: LMA  Additional Equipment: None  Intra-op Plan:   Post-operative Plan:   Informed Consent: I have reviewed the patients History and Physical, chart, labs and discussed the procedure including the risks, benefits and alternatives for the proposed anesthesia with the patient or authorized representative who has indicated his/her understanding and acceptance.     Dental advisory given  Plan Discussed with: CRNA and Anesthesiologist  Anesthesia Plan Comments:         Anesthesia Quick Evaluation

## 2023-01-16 ENCOUNTER — Ambulatory Visit (HOSPITAL_BASED_OUTPATIENT_CLINIC_OR_DEPARTMENT_OTHER): Payer: Self-pay | Admitting: Anesthesiology

## 2023-01-16 ENCOUNTER — Encounter (HOSPITAL_BASED_OUTPATIENT_CLINIC_OR_DEPARTMENT_OTHER): Payer: Self-pay | Admitting: Obstetrics and Gynecology

## 2023-01-16 ENCOUNTER — Encounter (HOSPITAL_BASED_OUTPATIENT_CLINIC_OR_DEPARTMENT_OTHER)
Admission: RE | Disposition: A | Discharge status: Home / Self Care | Payer: Self-pay | Source: Home / Self Care | Attending: Obstetrics and Gynecology

## 2023-01-16 ENCOUNTER — Other Ambulatory Visit: Payer: Self-pay

## 2023-01-16 ENCOUNTER — Ambulatory Visit (HOSPITAL_BASED_OUTPATIENT_CLINIC_OR_DEPARTMENT_OTHER)
Admission: RE | Admit: 2023-01-16 | Discharge: 2023-01-16 | Disposition: A | Discharge status: Home / Self Care | Payer: BC Managed Care – PPO | Attending: Obstetrics and Gynecology | Admitting: Obstetrics and Gynecology

## 2023-01-16 DIAGNOSIS — D250 Submucous leiomyoma of uterus: Secondary | ICD-10-CM

## 2023-01-16 DIAGNOSIS — D219 Benign neoplasm of connective and other soft tissue, unspecified: Secondary | ICD-10-CM | POA: Diagnosis present

## 2023-01-16 DIAGNOSIS — N938 Other specified abnormal uterine and vaginal bleeding: Secondary | ICD-10-CM | POA: Diagnosis present

## 2023-01-16 DIAGNOSIS — D259 Leiomyoma of uterus, unspecified: Secondary | ICD-10-CM | POA: Diagnosis not present

## 2023-01-16 HISTORY — PX: DILATATION & CURETTAGE/HYSTEROSCOPY WITH MYOSURE: SHX6511

## 2023-01-16 LAB — TYPE AND SCREEN
ABO/RH(D): O POS
Antibody Screen: NEGATIVE

## 2023-01-16 LAB — ABO/RH: ABO/RH(D): O POS

## 2023-01-16 LAB — CBC
HCT: 40.8 % (ref 36.0–46.0)
Hemoglobin: 13.7 g/dL (ref 12.0–15.0)
MCH: 30.2 pg (ref 26.0–34.0)
MCHC: 33.6 g/dL (ref 30.0–36.0)
MCV: 90.1 fL (ref 80.0–100.0)
Platelets: 244 10*3/uL (ref 150–400)
RBC: 4.53 MIL/uL (ref 3.87–5.11)
RDW: 14.8 % (ref 11.5–15.5)
WBC: 7.8 10*3/uL (ref 4.0–10.5)
nRBC: 0 % (ref 0.0–0.2)

## 2023-01-16 LAB — POCT PREGNANCY, URINE: Preg Test, Ur: NEGATIVE

## 2023-01-16 SURGERY — RADIOFREQUENCY ABLATION, LEIOMYOMA, UTERUS, TRANSCERVICAL APPROACH, WITH US GUIDANCE
Anesthesia: General | Site: Vagina

## 2023-01-16 MED ORDER — FENTANYL CITRATE (PF) 100 MCG/2ML IJ SOLN
INTRAMUSCULAR | Status: AC
  Filled 2023-01-16: qty 2

## 2023-01-16 MED ORDER — GLYCOPYRROLATE PF 0.2 MG/ML IJ SOSY
PREFILLED_SYRINGE | INTRAMUSCULAR | Status: DC | PRN
  Administered 2023-01-16: .2 mg via INTRAVENOUS

## 2023-01-16 MED ORDER — HYDROMORPHONE HCL 1 MG/ML IJ SOLN: 0.2500 mg | INTRAMUSCULAR | Status: DC | PRN

## 2023-01-16 MED ORDER — FENTANYL CITRATE (PF) 100 MCG/2ML IJ SOLN
INTRAMUSCULAR | Status: DC | PRN
  Administered 2023-01-16 (×2): 50 ug via INTRAVENOUS

## 2023-01-16 MED ORDER — EPHEDRINE SULFATE-NACL 50-0.9 MG/10ML-% IV SOSY
PREFILLED_SYRINGE | INTRAVENOUS | Status: DC | PRN
  Administered 2023-01-16: 10 mg via INTRAVENOUS
  Administered 2023-01-16: 5 mg via INTRAVENOUS

## 2023-01-16 MED ORDER — PROPOFOL 10 MG/ML IV BOLUS
INTRAVENOUS | Status: AC
  Filled 2023-01-16: qty 20

## 2023-01-16 MED ORDER — ONDANSETRON HCL 4 MG/2ML IJ SOLN: 4.0000 mg | Freq: Once | INTRAMUSCULAR | Status: DC | PRN

## 2023-01-16 MED ORDER — ACETAMINOPHEN 500 MG PO TABS
ORAL_TABLET | ORAL | Status: AC
  Filled 2023-01-16: qty 2

## 2023-01-16 MED ORDER — OXYCODONE HCL 5 MG PO TABS: 5.0000 mg | ORAL_TABLET | Freq: Once | ORAL | Status: DC | PRN

## 2023-01-16 MED ORDER — ONDANSETRON HCL 4 MG/2ML IJ SOLN
INTRAMUSCULAR | Status: DC | PRN
  Administered 2023-01-16: 4 mg via INTRAVENOUS

## 2023-01-16 MED ORDER — MIDAZOLAM HCL 2 MG/2ML IJ SOLN
INTRAMUSCULAR | Status: AC
Start: 2023-01-16 — End: ?
  Filled 2023-01-16: qty 2

## 2023-01-16 MED ORDER — GLYCOPYRROLATE PF 0.2 MG/ML IJ SOSY
PREFILLED_SYRINGE | INTRAMUSCULAR | Status: AC
  Filled 2023-01-16: qty 1

## 2023-01-16 MED ORDER — LIDOCAINE HCL (PF) 2 % IJ SOLN
INTRAMUSCULAR | Status: AC
  Filled 2023-01-16: qty 5

## 2023-01-16 MED ORDER — LACTATED RINGERS IV SOLN: INTRAVENOUS | Status: DC

## 2023-01-16 MED ORDER — DEXMEDETOMIDINE HCL IN NACL 80 MCG/20ML IV SOLN
INTRAVENOUS | Status: DC | PRN
  Administered 2023-01-16: 8 ug via INTRAVENOUS

## 2023-01-16 MED ORDER — ACETAMINOPHEN 500 MG PO TABS: 1000.0000 mg | ORAL_TABLET | ORAL | Status: DC

## 2023-01-16 MED ORDER — EPHEDRINE 5 MG/ML INJ
INTRAVENOUS | Status: AC
  Filled 2023-01-16: qty 5

## 2023-01-16 MED ORDER — LIDOCAINE 2% (20 MG/ML) 5 ML SYRINGE
INTRAMUSCULAR | Status: DC | PRN
  Administered 2023-01-16: 100 mg via INTRAVENOUS

## 2023-01-16 MED ORDER — POVIDONE-IODINE 10 % EX SWAB: 2.0000 | Freq: Once | CUTANEOUS | Status: DC

## 2023-01-16 MED ORDER — ONDANSETRON HCL 4 MG/2ML IJ SOLN
INTRAMUSCULAR | Status: AC
  Filled 2023-01-16: qty 2

## 2023-01-16 MED ORDER — PROPOFOL 10 MG/ML IV BOLUS
INTRAVENOUS | Status: DC | PRN
  Administered 2023-01-16: 190 mg via INTRAVENOUS

## 2023-01-16 MED ORDER — KETOROLAC TROMETHAMINE 30 MG/ML IJ SOLN: 30.0000 mg | Freq: Once | INTRAMUSCULAR | Status: DC | PRN

## 2023-01-16 MED ORDER — OXYCODONE HCL 5 MG/5ML PO SOLN: 5.0000 mg | Freq: Once | ORAL | Status: DC | PRN

## 2023-01-16 MED ORDER — KETOROLAC TROMETHAMINE 30 MG/ML IJ SOLN
INTRAMUSCULAR | Status: DC | PRN
  Administered 2023-01-16: 30 mg via INTRAVENOUS

## 2023-01-16 MED ORDER — DEXAMETHASONE SODIUM PHOSPHATE 10 MG/ML IJ SOLN
INTRAMUSCULAR | Status: DC | PRN
  Administered 2023-01-16: 10 mg via INTRAVENOUS

## 2023-01-16 MED ORDER — HYDROMORPHONE HCL 2 MG/ML IJ SOLN
INTRAMUSCULAR | Status: AC
  Filled 2023-01-16: qty 1

## 2023-01-16 MED ORDER — OXYCODONE HCL 5 MG PO TABS: 5.0000 mg | ORAL_TABLET | ORAL | 0 refills | Status: DC | PRN

## 2023-01-16 MED ORDER — MIDAZOLAM HCL 5 MG/5ML IJ SOLN
INTRAMUSCULAR | Status: DC | PRN
  Administered 2023-01-16: 2 mg via INTRAVENOUS

## 2023-01-16 MED ORDER — SODIUM CHLORIDE 0.9 % IV SOLN: INTRAVENOUS | Status: DC

## 2023-01-16 MED ORDER — DEXAMETHASONE SODIUM PHOSPHATE 10 MG/ML IJ SOLN
INTRAMUSCULAR | Status: AC
  Filled 2023-01-16: qty 1

## 2023-01-16 MED ORDER — IBUPROFEN 800 MG PO TABS: 800.0000 mg | ORAL_TABLET | Freq: Three times a day (TID) | ORAL | 0 refills | Status: DC | PRN

## 2023-01-16 MED ORDER — STERILE WATER FOR IRRIGATION IR SOLN
Status: DC | PRN
Start: 2023-01-16 — End: 2023-01-16
  Administered 2023-01-16: 500 mL

## 2023-01-16 SURGICAL SUPPLY — 20 items
CATH ROBINSON RED A/P 16FR (CATHETERS) ×2 IMPLANT
DEVICE MYOSURE LITE (MISCELLANEOUS) IMPLANT
DEVICE MYOSURE REACH (MISCELLANEOUS) IMPLANT
DRSG TELFA 3X8 NADH STRL (GAUZE/BANDAGES/DRESSINGS) ×2 IMPLANT
ELECT DISPERSIVE SONATA (ELECTRODE) ×4 IMPLANT
GAUZE 4X4 16PLY ~~LOC~~+RFID DBL (SPONGE) ×2 IMPLANT
GLOVE BIO SURGEON STRL SZ 6 (GLOVE) ×2 IMPLANT
GOWN STRL REUS W/ TWL LRG LVL3 (GOWN DISPOSABLE) ×2 IMPLANT
HANDPIECE RFA SONATA (MISCELLANEOUS) ×2 IMPLANT
KIT PROCEDURE FLUENT (KITS) IMPLANT
KIT TURNOVER CYSTO (KITS) ×2 IMPLANT
MYOSURE XL FIBROID (MISCELLANEOUS)
PACK VAGINAL MINOR WOMEN LF (CUSTOM PROCEDURE TRAY) ×2 IMPLANT
PAD OB MATERNITY 4.3X12.25 (PERSONAL CARE ITEMS) ×2 IMPLANT
PAD PREP 24X48 CUFFED NSTRL (MISCELLANEOUS) ×2 IMPLANT
SEAL ROD LENS SCOPE MYOSURE (ABLATOR) IMPLANT
SLEEVE SCD COMPRESS KNEE MED (STOCKING) ×4 IMPLANT
SYSTEM TISS REMOVAL MYOSURE XL (MISCELLANEOUS) IMPLANT
TOWEL OR 17X24 6PK STRL BLUE (TOWEL DISPOSABLE) ×2 IMPLANT
WATER STERILE IRR 500ML POUR (IV SOLUTION) IMPLANT

## 2023-01-16 NOTE — Anesthesia Procedure Notes (Signed)
 Procedure Name: LMA Insertion Date/Time: 01/16/2023 9:57 AM  Performed by: Franchot Delon RAMAN, CRNAPre-anesthesia Checklist: Patient identified, Emergency Drugs available, Suction available and Patient being monitored Patient Re-evaluated:Patient Re-evaluated prior to induction Oxygen Delivery Method: Circle System Utilized Preoxygenation: Pre-oxygenation with 100% oxygen Induction Type: IV induction Ventilation: Mask ventilation without difficulty LMA: LMA inserted LMA Size: 4.0 Number of attempts: 1 Placement Confirmation: positive ETCO2 Tube secured with: Tape Dental Injury: Teeth and Oropharynx as per pre-operative assessment

## 2023-01-16 NOTE — Discharge Instructions (Signed)
  Post Anesthesia Home Care Instructions  Activity: Get plenty of rest for the remainder of the day. A responsible individual must stay with you for 24 hours following the procedure.  For the next 24 hours, DO NOT: -Drive a car -Operate machinery -Drink alcoholic beverages -Take any medication unless instructed by your physician -Make any legal decisions or sign important papers.  Meals: Start with liquid foods such as gelatin or soup. Progress to regular foods as tolerated. Avoid greasy, spicy, heavy foods. If nausea and/or vomiting occur, drink only clear liquids until the nausea and/or vomiting subsides. Call your physician if vomiting continues.  Special Instructions/Symptoms: Your throat may feel dry or sore from the anesthesia or the breathing tube placed in your throat during surgery. If this causes discomfort, gargle with warm salt water. The discomfort should disappear within 24 hours.  D & C Home care Instructions:   Personal hygiene:  Used sanitary napkins for vaginal drainage not tampons. Shower or tub bathe the day after your procedure. No douching until bleeding stops. Always wipe from front to back after  Elimination.  Activity: Do not drive or operate any equipment today. The effects of the anesthesia are still present and drowsiness may result. Rest today, not necessarily flat bed rest, just take it easy. You may resume your normal activity in one to 2 days.  Sexual activity: No intercourse for one week or as indicated by your physician  Diet: Eat a light diet as desired this evening. You may resume a regular diet tomorrow.  Return to work: One to 2 days.  General Expectations of your surgery: Vaginal bleeding should be no heavier than a normal period. Spotting may continue up to 10 days. Mild cramps may continue for a couple of days. You may have a regular period in 2-6 weeks.  Unexpected observations call your doctor if these occur: persistent or heavy bleeding.  Severe abdominal cramping or pain. Elevation of temperature greater than 100F.  Call for an appointment in one week.    

## 2023-01-16 NOTE — Anesthesia Postprocedure Evaluation (Signed)
 Anesthesia Post Note  Patient: Frances Perez  Procedure(s) Performed: Radio Frequency Ablation with Sonata  (Vagina ) DILATATION & CURETTAGE/HYSTEROSCOPY WITH MYOSURE (Uterus)     Patient location during evaluation: PACU Anesthesia Type: General Level of consciousness: awake and alert Pain management: pain level controlled Vital Signs Assessment: post-procedure vital signs reviewed and stable Respiratory status: spontaneous breathing, nonlabored ventilation, respiratory function stable and patient connected to nasal cannula oxygen Cardiovascular status: blood pressure returned to baseline and stable Postop Assessment: no apparent nausea or vomiting Anesthetic complications: no   No notable events documented.  Last Vitals:  Vitals:   01/16/23 1057 01/16/23 1149  BP: 126/85 122/86  Pulse:  (!) 58  Resp:  16  Temp: 36.7 C (!) 36.4 C  SpO2:  97%    Last Pain:  Vitals:   01/16/23 1137  TempSrc:   PainSc: 0-No pain                 Garnette DELENA Gab

## 2023-01-16 NOTE — Op Note (Signed)
 Preop: DUB, submucosal fibroids Postop: Same and pedunculated fibroid Procedure: Dilation, hysteroscopy, myosure myomectomy of pedunculated fibroid and sonata  fibroid ablation on other submucosal fibroids.  Surgeon: Dr. Cleatus Assist: None EBL 5 cc Specimens: fibroid Findings: Normal external female genitalia. Normal appearing cervix and vagina. Normal ostia visualized bilaterally. Uterine fibroids noted:   Description of the procedure: Informed consent reviewed and signed prior to the procedure after the patient was given opportunity to ask questions. The patient was taken to the operating room where general anesthesia was found to be adequate. A timeout was performed confirming the patient and the procedure.   She was prepared and draped in the dorsal lithotomy fashion.   Open sided speculum placed into the patient's vagina. Single tooth tenaculum applied to the 12 o'clock position of the cervix. Uterus sounded to 11 cm.  Cervix progressively dilated to a 27 Pratt. 30 degree hysteroscope was inserted into the cavity with the aforementioned findings noted. Pedunculated fibroid noted so myosure device was used to remove the fibroid at the stalk from the submucosal surface. Once freed, I grasped it with polyp forceps and removed it through the cervix. This was sent to pathology. I did not use the Sonata  on this fibroid.   The Sonata  headpiece was then inserted and a compete uterine survey was performed with the ultrasound. Sonata  ablation was then performed:  Fibroid 1: 2.5 cm, anterior fundal in midline  Ablation 1: 2.6 x 1.9 cm, 2:18  Fibroid 2: 5 cm, left posterior midbody. 3 ablations done on this fibroid.   Ablation 1: 2.8 x 2.0 cm, 2:36  Ablation 2: 2.6 x 1.9 cm, 2:24  Ablation 3: 2.9 x 2.2 cm, 2:54   All cycles were carried out under direct ultrasound intrauterine guidance and visualization with the ablation guide noted within the serosa at all times. The fibroids treated appeared  ablated with US  guidance with outgassing noted by US  appearance.   Hemostatic at the end of the procedure. Procedure completed. All instruments removed. Counts correct x2.  Pt taken awake to recovery room in stable condition. She tolerated the procedure well.   Frances Cleatus, MD Attending Obstetrician & Gynecologist, [facility] for [facility], [facility]

## 2023-01-16 NOTE — H&P (Signed)
 [facility] Gynecology Attending History and Physical  Frances Perez is a 53 y.o. [id number] who presented to [facility] today for Sonata  for her fibroids causing heavy bleeding. Since removal of her IUD the bleeding is not worse but she had a pain that would occur in her side that has now resolve since removal so she is feeling better.   Past Medical History:  Diagnosis Date   Cancer (HCC)    skin   Complication of anesthesia    Fibroid    History of COVID-19 01/2020   mild cold symptoms x 3 days all symptoms resolved   Past Surgical History:  Procedure Laterality Date   c sections     2003 and 2006   WISDOM TOOTH EXTRACTION     OB History  Gravida Para Term Preterm AB Living  2 2 2   2   SAB IAB Ectopic Multiple Live Births          # Outcome Date GA Lbr Len/2nd Weight Sex Type Anes PTL Lv  2 Term           1 Term           Patient denies any other pertinent gynecologic issues.  No current facility-administered medications on file prior to encounter.   Current Outpatient Medications on File Prior to Encounter  Medication Sig Dispense Refill   Multiple Vitamin (MULTIVITAMIN) capsule Take 1 capsule by mouth daily.     Cholecalciferol (VITAMIN D3) 30 MCG/15ML LIQD Take by mouth.     levonorgestrel (MIRENA) 20 MCG/DAY IUD 1 each by Intrauterine route once. Inserted 2019 (Patient not taking: Reported on 12/18/2022)     tranexamic acid  (LYSTEDA ) 650 MG TABS tablet Take 2 tablets (1,300 mg total) by mouth 3 (three) times daily. Take during menses for a maximum of five days 30 tablet 2   No Known Allergies  Social History:   reports that she has never smoked. She has never used smokeless tobacco. She reports current alcohol use. She reports that she does not use drugs. Family History  Problem Relation Age of Onset   Colon polyps Mother    Healthy Mother    Melanoma Father    Esophageal cancer Neg Hx    Rectal cancer Neg Hx    Stomach cancer Neg Hx    Colon cancer  Neg Hx     Review of Systems: Pertinent items noted in HPI and remainder of comprehensive ROS otherwise negative.  PHYSICAL EXAM: Height 5' 8 (1.727 m), weight 74.8 kg, last menstrual period 12/23/2022. CONSTITUTIONAL: Well-developed, well-nourished female in no acute distress.  HENT:  Normocephalic, atraumatic, External right and left ear normal. Oropharynx is clear and moist EYES: Conjunctivae and EOM are normal. Pupils are equal, round, and reactive to light. No scleral icterus.  NECK: Normal range of motion, supple, no masses SKIN: Skin is warm and dry. No rash noted. Not diaphoretic. No erythema. No pallor. NEUROLOGIC: Alert and oriented to person, place, and time. Normal reflexes, muscle tone coordination. No cranial nerve deficit noted. PSYCHIATRIC: Normal mood and affect. Normal behavior. Normal judgment and thought content. CARDIOVASCULAR: Normal heart rate noted, regular rhythm RESPIRATORY: Effort and breath sounds normal, no problems with respiration noted ABDOMEN: Soft, nontender, nondistended. MUSCULOSKELETAL: Normal range of motion. No tenderness.  No cyanosis, clubbing, or edema.  2+ distal pulses.  Labs: Results for orders placed or performed during the hospital encounter of 01/16/23 (from the past 2 weeks)  Pregnancy, urine POC  Collection Time: 01/16/23  8:16 AM  Result Value Ref Range   Preg Test, Ur NEGATIVE NEGATIVE    Assessment: Active Problems:   Fibroid   Dysfunctional uterine bleeding   Plan: Heavy and prolonged menstrual bleeding, likely due to multiple fibroids, including one inside the uterus.  - We discussed all options available - expectant management, TXA, GNRH antagonists, hormonal birth control, hysteroscopic myomectomy/sonata , UAE, and hysterectomy. We discussed all the risks and benefits to each treatment option. We reviewed the recovery and limitations following each procedure. I discussed the risks of complications with surgery in detail.  -  Sonata  procedure to treat fibroids is the plan today. Reviewed prior to sonata  doing D/C/H. Discussed if uterine perforation, I can complete the surgery but would perform concomittant L/S if she would prefer. She would prefer completion of surgery today. Reviewed chance of this is <1%. If I was in her abdomen for some reason, then I could also address the cyst and to this she consents as well.  - I signed consent. She will sign with witness once available.    Vina Solian, MD, FACOG Obstetrician & Gynecologist, [facility] for [facility], [facility]

## 2023-01-16 NOTE — Transfer of Care (Signed)
 Immediate Anesthesia Transfer of Care Note  Patient: Frances Perez  Procedure(s) Performed: Radio Frequency Ablation with Sonata  (Vagina ) DILATATION & CURETTAGE/HYSTEROSCOPY WITH MYOSURE (Uterus)  Patient Location: PACU  Anesthesia Type:General  Level of Consciousness: awake, alert , oriented, and patient cooperative  Airway & Oxygen Therapy: Patient Spontanous Breathing  Post-op Assessment: Report given to RN and Post -op Vital signs reviewed and stable  Post vital signs: Reviewed and stable  Last Vitals:  Vitals Value Taken Time  BP    Temp    Pulse 79 01/16/23 1059  Resp 12 01/16/23 1059  SpO2 94 % 01/16/23 1059  Vitals shown include unfiled device data.  Last Pain:  Vitals:   01/16/23 0843  TempSrc: Oral         Complications: No notable events documented.

## 2023-01-17 ENCOUNTER — Encounter (HOSPITAL_BASED_OUTPATIENT_CLINIC_OR_DEPARTMENT_OTHER): Payer: Self-pay | Admitting: Obstetrics and Gynecology

## 2023-01-17 ENCOUNTER — Encounter: Payer: Self-pay | Admitting: Gastroenterology

## 2023-01-17 LAB — SURGICAL PATHOLOGY

## 2023-01-22 ENCOUNTER — Encounter: Payer: No Typology Code available for payment source | Admitting: Gastroenterology

## 2023-02-06 NOTE — Progress Notes (Unsigned)
GYNECOLOGY OFFICE VISIT NOTE  History:   Frances Perez is a 53 y.o. [phone] here today for bleeding that has persisted since her surgery.  She had a myomectomy and sonata ablation of her fibroids on 1/8.   She bled following her surgery but it was not heavy. She then got her period which was heavy but then it continued since having her period. She has to change a pad or tampon every 4-5 hours at this point which is an improvement from her period. She wouldn't mind the heaviness if it wasn't prolonged.    The following portions of the patient's history were reviewed and updated as appropriate: allergies, current medications, past family history, past medical history, past social history, past surgical history and problem list.   Health Maintenance:   Normal pap and negative HRHPV:   Diagnosis  Date Value Ref Range Status  11/15/2022   Final   - Negative for intraepithelial lesion or malignancy (NILM)   Normal mammogram on 11/05/22.   Review of Systems:  Pertinent items noted in HPI and remainder of comprehensive ROS otherwise negative.  Physical Exam:  BP 126/83   Pulse 75   Ht 5\' 8"  (1.727 m)   Wt 165 lb (74.8 kg)   LMP 12/23/2022 (Exact Date)   BMI 25.09 kg/m  CONSTITUTIONAL: Well-developed, well-nourished female in no acute distress.  HEENT:  Normocephalic, atraumatic. External right and left ear normal. No scleral icterus.  NECK: Normal range of motion, supple, no masses noted on observation SKIN: No rash noted. Not diaphoretic. No erythema. No pallor. MUSCULOSKELETAL: Normal range of motion. No edema noted. NEUROLOGIC: Alert and oriented to person, place, and time. Normal muscle tone coordination. No cranial nerve deficit noted. PSYCHIATRIC: Normal mood and affect. Normal behavior. Normal judgment and thought content.  PELVIC: Normal appearing external genitalia; normal urethral meatus; normal appearing vaginal mucosa and cervix.  No abnormal discharge noted.  Normal  uterine size, no other palpable masses, no uterine or adnexal tenderness. Performed in the presence of a chaperone  Labs and Imaging No results found for this or any previous visit (from the past week). No results found.  Assessment and Plan:  Frances Perez was seen today for post-op problem.  Diagnoses and all orders for this visit:  Postop check -     norethindrone (AYGESTIN) 5 MG tablet; Take 1 tablet (5 mg total) by mouth daily. Take for 7-10 days for persistent bleeding  Breakthrough bleeding Cervix is closed, no evidence of prolapsing fibroid.  -Reviewed plan of care - she will try 5d course of TXA. If this doesn't work, she will let me know and then start a 7-10 day course of aygestin. 30 tablets given in the event she needs additional doses. If Aygestin does not help the bleeding, we then discussed [place].  -     norethindrone (AYGESTIN) 5 MG tablet; Take 1 tablet (5 mg total) by mouth daily. Take for 7-10 days for persistent bleeding   Meds ordered this encounter  Medications   norethindrone (AYGESTIN) 5 MG tablet    Sig: Take 1 tablet (5 mg total) by mouth daily. Take for 7-10 days for persistent bleeding    Dispense:  30 tablet    Refill:  0     Routine preventative health maintenance measures emphasized. Please refer to After Visit Summary for other counseling recommendations.   No follow-ups on file.  Milas Hock, MD, FACOG Obstetrician & Gynecologist, [facility] for [facility], [facility]

## 2023-02-07 ENCOUNTER — Encounter: Payer: Self-pay | Admitting: Obstetrics and Gynecology

## 2023-02-07 ENCOUNTER — Ambulatory Visit (INDEPENDENT_AMBULATORY_CARE_PROVIDER_SITE_OTHER): Payer: BC Managed Care – PPO | Admitting: Obstetrics and Gynecology

## 2023-02-07 VITALS — BP 126/83 | HR 75 | Ht 68.0 in | Wt 165.0 lb

## 2023-02-07 DIAGNOSIS — Z09 Encounter for follow-up examination after completed treatment for conditions other than malignant neoplasm: Secondary | ICD-10-CM | POA: Diagnosis not present

## 2023-02-07 DIAGNOSIS — N921 Excessive and frequent menstruation with irregular cycle: Secondary | ICD-10-CM | POA: Diagnosis not present

## 2023-02-07 MED ORDER — NORETHINDRONE ACETATE 5 MG PO TABS: 5.0000 mg | ORAL_TABLET | Freq: Every day | ORAL | 0 refills | Status: DC

## 2023-02-25 ENCOUNTER — Encounter: Payer: Self-pay | Admitting: Obstetrics and Gynecology

## 2023-02-25 DIAGNOSIS — N926 Irregular menstruation, unspecified: Secondary | ICD-10-CM

## 2023-03-04 ENCOUNTER — Ambulatory Visit (INDEPENDENT_AMBULATORY_CARE_PROVIDER_SITE_OTHER): Payer: BC Managed Care – PPO

## 2023-03-04 DIAGNOSIS — N939 Abnormal uterine and vaginal bleeding, unspecified: Secondary | ICD-10-CM | POA: Diagnosis not present

## 2023-03-04 DIAGNOSIS — N854 Malposition of uterus: Secondary | ICD-10-CM | POA: Diagnosis not present

## 2023-03-04 DIAGNOSIS — N926 Irregular menstruation, unspecified: Secondary | ICD-10-CM

## 2023-03-04 DIAGNOSIS — D259 Leiomyoma of uterus, unspecified: Secondary | ICD-10-CM | POA: Diagnosis not present

## 2023-03-05 ENCOUNTER — Other Ambulatory Visit: Payer: BC Managed Care – PPO

## 2023-03-07 ENCOUNTER — Encounter: Payer: Self-pay | Admitting: Obstetrics and Gynecology

## 2023-03-14 ENCOUNTER — Encounter: Payer: BC Managed Care – PPO | Admitting: Gastroenterology

## 2023-03-15 ENCOUNTER — Telehealth: Payer: Self-pay | Admitting: *Deleted

## 2023-03-15 NOTE — Telephone Encounter (Signed)
-----   Message from [place] sent at 03/14/2023 10:40 PM EST ----- Have called her a few times -  I think it would be best if we bring her in so I can see her again and check on how she is doing and make a plan to help her if still bleeding. Can you make her an appointment? Thanks! pad

## 2023-03-15 NOTE — Telephone Encounter (Signed)
Left patient a message to call and schedule. 

## 2023-04-11 ENCOUNTER — Ambulatory Visit (INDEPENDENT_AMBULATORY_CARE_PROVIDER_SITE_OTHER): Admitting: Obstetrics and Gynecology

## 2023-04-11 ENCOUNTER — Encounter: Payer: Self-pay | Admitting: Obstetrics and Gynecology

## 2023-04-11 ENCOUNTER — Ambulatory Visit: Admitting: Obstetrics and Gynecology

## 2023-04-11 VITALS — BP 132/88 | HR 68 | Ht 68.0 in | Wt 165.0 lb

## 2023-04-11 DIAGNOSIS — D219 Benign neoplasm of connective and other soft tissue, unspecified: Secondary | ICD-10-CM

## 2023-04-11 DIAGNOSIS — N920 Excessive and frequent menstruation with regular cycle: Secondary | ICD-10-CM | POA: Diagnosis not present

## 2023-04-11 DIAGNOSIS — N83201 Unspecified ovarian cyst, right side: Secondary | ICD-10-CM

## 2023-04-11 MED ORDER — NORETHINDRONE ACETATE 5 MG PO TABS: 5.0000 mg | ORAL_TABLET | Freq: Every day | ORAL | 1 refills | Status: DC

## 2023-04-11 NOTE — Progress Notes (Signed)
 GYNECOLOGY OFFICE VISIT NOTE  History:   Frances Perez is a 53 y.o. [id number] here today for follow up from her ultrasound.   She had sonata and fibroid removal on 1/8. Pathology was benign including curettings that were negative for hyperplasia or malignancy.   Since her procedure, her periods have been heavier although now regular. She is passing large clots, about the size of a baseball. She did use the aygestin which did provide some relief and helped her bleeding.        Past Medical History:  Diagnosis Date   Cancer (HCC)    skin   Complication of anesthesia    Fibroid    History of COVID-19 01/2020   mild cold symptoms x 3 days all symptoms resolved    Past Surgical History:  Procedure Laterality Date   c sections     2003 and 2006   DILATATION & CURETTAGE/HYSTEROSCOPY WITH MYOSURE N/A 01/16/2023   Procedure: DILATATION & CURETTAGE/HYSTEROSCOPY WITH MYOSURE;  Surgeon: Milas Hock, MD;  Location: [facility];  Service: Gynecology;  Laterality: N/A;   WISDOM TOOTH EXTRACTION      The following portions of the patient's history were reviewed and updated as appropriate: allergies, current medications, past family history, past medical history, past social history, past surgical history and problem list.   Health Maintenance:   Normal pap and negative HRHPV on 11/2022   Diagnosis  Date Value Ref Range Status  11/15/2022   Final   - Negative for intraepithelial lesion or malignancy (NILM)     Normal mammogram on 10/2022.   Review of Systems:  Pertinent items noted in HPI and remainder of comprehensive ROS otherwise negative.  Physical Exam:  BP 132/88   Pulse 68   Ht 5\' 8"  (1.727 m)   Wt 165 lb (74.8 kg)   LMP 03/21/2023   BMI 25.09 kg/m  CONSTITUTIONAL: Well-developed, well-nourished female in no acute distress.  HEENT:  Normocephalic, atraumatic. External right and left ear normal. No scleral icterus.  NECK: Normal range of motion, supple,  no masses noted on observation SKIN: No rash noted. Not diaphoretic. No erythema. No pallor. MUSCULOSKELETAL: Normal range of motion. No edema noted. NEUROLOGIC: Alert and oriented to person, place, and time. Normal muscle tone coordination. No cranial nerve deficit noted. PSYCHIATRIC: Normal mood and affect. Normal behavior. Normal judgment and thought content.  PELVIC: Deferred  Labs and Imaging No results found for this or any previous visit (from the past week). No results found.  Assessment and Plan:   1. Heavy periods Discussed options available - expectant management, hormonal therapy, TXA, endometrial ablation and hysterectomy. We discussed the risks and benefits of each and she would like to do endometrial ablation. She had recent endometrial sampling on 1/8 so no need to repeat.  Message sent to scheduler, Serita Sheller, for surgery.   2. Fibroid Reducing in size.   3. Right ovarian cyst Resolved on last [place].     Meds ordered this encounter  Medications   DISCONTD: norethindrone (AYGESTIN) 5 MG tablet    Sig: Take 1 tablet (5 mg total) by mouth daily. Take for 7-10 days for persistent bleeding    Dispense:  30 tablet    Refill:  1   norethindrone (AYGESTIN) 5 MG tablet    Sig: Take 1 tablet (5 mg total) by mouth daily. Take for 7-10 days for persistent bleeding    Dispense:  30 tablet    Refill:  1  Routine preventative health maintenance measures emphasized. Please refer to After Visit Summary for other counseling recommendations.   Return if symptoms worsen or fail to improve.  Milas Hock, MD, FACOG Obstetrician & Gynecologist, [facility] for [facility], [facility]

## 2023-06-10 ENCOUNTER — Telehealth: Payer: Self-pay

## 2023-06-10 NOTE — Telephone Encounter (Signed)
 I called patient to see if she's available for surgery w/ Dr. Vallarie Gauze on 07/29/23. I left a voicemail asking patient to call me back to schedule at [phone].

## 2023-07-01 DIAGNOSIS — L821 Other seborrheic keratosis: Secondary | ICD-10-CM | POA: Diagnosis not present

## 2023-07-01 DIAGNOSIS — D489 Neoplasm of uncertain behavior, unspecified: Secondary | ICD-10-CM | POA: Diagnosis not present

## 2023-07-01 DIAGNOSIS — D1801 Hemangioma of skin and subcutaneous tissue: Secondary | ICD-10-CM | POA: Diagnosis not present

## 2023-07-01 DIAGNOSIS — D2262 Melanocytic nevi of left upper limb, including shoulder: Secondary | ICD-10-CM | POA: Diagnosis not present

## 2023-07-01 DIAGNOSIS — C44629 Squamous cell carcinoma of skin of left upper limb, including shoulder: Secondary | ICD-10-CM | POA: Diagnosis not present

## 2023-07-01 DIAGNOSIS — L814 Other melanin hyperpigmentation: Secondary | ICD-10-CM | POA: Diagnosis not present

## 2023-07-07 DIAGNOSIS — Z6825 Body mass index (BMI) 25.0-25.9, adult: Secondary | ICD-10-CM | POA: Diagnosis not present

## 2023-07-07 DIAGNOSIS — J039 Acute tonsillitis, unspecified: Secondary | ICD-10-CM | POA: Diagnosis not present

## 2023-07-25 ENCOUNTER — Other Ambulatory Visit: Payer: Self-pay

## 2023-07-25 ENCOUNTER — Encounter (HOSPITAL_COMMUNITY): Payer: Self-pay | Admitting: Obstetrics and Gynecology

## 2023-07-25 NOTE — Progress Notes (Signed)
 PCP - none Cardiologist - none  Chest x-ray - n/a EKG - n/a Stress Test - n/a ECHO - n/a Cardiac Cath - n/a  ICD Pacemaker/Loop - n/a  Sleep Study -  n/a  Diabetes - n/a  Aspirin & Blood Thinner Instructions:  n/a  NPO   Anesthesia review: no  STOP now taking any Aspirin (unless otherwise instructed by your surgeon), Aleve, Naproxen, Ibuprofen , Motrin , Advil , Goody's, BC's, all herbal medications, fish oil, and all vitamins.   Coronavirus Screening Do you have any of the following symptoms:  Cough yes/no: No Fever (>100.73F)  yes/no: No Runny nose yes/no: No Sore throat yes/no: No Difficulty breathing/shortness of breath  yes/no: No  Have you traveled in the last 14 days and where? yes/no: No  Patient verbalized understanding of instructions that were given via phone.

## 2023-07-29 ENCOUNTER — Ambulatory Visit (HOSPITAL_COMMUNITY): Payer: Self-pay | Admitting: Anesthesiology

## 2023-07-29 ENCOUNTER — Ambulatory Visit (HOSPITAL_COMMUNITY)
Admission: RE | Admit: 2023-07-29 | Discharge: 2023-07-29 | Disposition: A | Discharge status: Home / Self Care | Attending: Obstetrics and Gynecology | Admitting: Obstetrics and Gynecology

## 2023-07-29 ENCOUNTER — Other Ambulatory Visit: Payer: Self-pay

## 2023-07-29 ENCOUNTER — Encounter (HOSPITAL_COMMUNITY): Payer: Self-pay | Admitting: Obstetrics and Gynecology

## 2023-07-29 ENCOUNTER — Encounter (HOSPITAL_COMMUNITY)
Admission: RE | Disposition: A | Discharge status: Home / Self Care | Payer: Self-pay | Source: Home / Self Care | Attending: Obstetrics and Gynecology

## 2023-07-29 DIAGNOSIS — N938 Other specified abnormal uterine and vaginal bleeding: Secondary | ICD-10-CM | POA: Diagnosis not present

## 2023-07-29 DIAGNOSIS — D259 Leiomyoma of uterus, unspecified: Secondary | ICD-10-CM

## 2023-07-29 DIAGNOSIS — D251 Intramural leiomyoma of uterus: Secondary | ICD-10-CM | POA: Diagnosis present

## 2023-07-29 DIAGNOSIS — N920 Excessive and frequent menstruation with regular cycle: Secondary | ICD-10-CM | POA: Diagnosis present

## 2023-07-29 DIAGNOSIS — D219 Benign neoplasm of connective and other soft tissue, unspecified: Secondary | ICD-10-CM

## 2023-07-29 HISTORY — DX: Personal history of other diseases of the respiratory system: Z87.09

## 2023-07-29 HISTORY — DX: Pneumonia, unspecified organism: J18.9

## 2023-07-29 HISTORY — PX: HYSTEROSCOPY: SHX211

## 2023-07-29 LAB — CBC
HCT: 37.5 % (ref 36.0–46.0)
Hemoglobin: 11.7 g/dL — ABNORMAL LOW (ref 12.0–15.0)
MCH: 25.9 pg — ABNORMAL LOW (ref 26.0–34.0)
MCHC: 31.2 g/dL (ref 30.0–36.0)
MCV: 83 fL (ref 80.0–100.0)
Platelets: 347 K/uL (ref 150–400)
RBC: 4.52 MIL/uL (ref 3.87–5.11)
RDW: 16.4 % — ABNORMAL HIGH (ref 11.5–15.5)
WBC: 6.1 K/uL (ref 4.0–10.5)
nRBC: 0 % (ref 0.0–0.2)

## 2023-07-29 LAB — TYPE AND SCREEN
ABO/RH(D): O POS
Antibody Screen: NEGATIVE

## 2023-07-29 SURGERY — ABLATION, ENDOMETRIUM, HYSTEROSCOPIC
Anesthesia: Monitor Anesthesia Care | Site: Uterus

## 2023-07-29 MED ORDER — ONDANSETRON HCL 4 MG/2ML IJ SOLN: 4.0000 mg | Freq: Once | INTRAMUSCULAR | Status: DC | PRN

## 2023-07-29 MED ORDER — FENTANYL CITRATE (PF) 100 MCG/2ML IJ SOLN: 25.0000 ug | INTRAMUSCULAR | Status: DC | PRN

## 2023-07-29 MED ORDER — ORAL CARE MOUTH RINSE: 15.0000 mL | Freq: Once | OROMUCOSAL | Status: AC

## 2023-07-29 MED ORDER — ACETAMINOPHEN 500 MG PO TABS: 1000.0000 mg | ORAL_TABLET | ORAL | Status: AC

## 2023-07-29 MED ORDER — HYDROMORPHONE HCL 1 MG/ML IJ SOLN
INTRAMUSCULAR | Status: AC
  Filled 2023-07-29: qty 0.5

## 2023-07-29 MED ORDER — OXYCODONE HCL 5 MG PO TABS: 5.0000 mg | ORAL_TABLET | ORAL | 0 refills | Status: AC | PRN

## 2023-07-29 MED ORDER — FENTANYL CITRATE (PF) 250 MCG/5ML IJ SOLN
INTRAMUSCULAR | Status: DC | PRN
  Administered 2023-07-29 (×2): 50 ug via INTRAVENOUS

## 2023-07-29 MED ORDER — MIDAZOLAM HCL 2 MG/2ML IJ SOLN
INTRAMUSCULAR | Status: DC | PRN
  Administered 2023-07-29: 2 mg via INTRAVENOUS

## 2023-07-29 MED ORDER — DEXMEDETOMIDINE HCL IN NACL 80 MCG/20ML IV SOLN
INTRAVENOUS | Status: DC | PRN
Start: 2023-07-29 — End: 2023-07-29
  Administered 2023-07-29: 8 ug via INTRAVENOUS

## 2023-07-29 MED ORDER — PROPOFOL 10 MG/ML IV BOLUS
INTRAVENOUS | Status: DC | PRN
  Administered 2023-07-29: 40 mg via INTRAVENOUS

## 2023-07-29 MED ORDER — DEXMEDETOMIDINE HCL IN NACL 80 MCG/20ML IV SOLN
INTRAVENOUS | Status: AC
  Filled 2023-07-29: qty 20

## 2023-07-29 MED ORDER — FENTANYL CITRATE (PF) 250 MCG/5ML IJ SOLN
INTRAMUSCULAR | Status: AC
  Filled 2023-07-29: qty 5

## 2023-07-29 MED ORDER — IBUPROFEN 800 MG PO TABS: 800.0000 mg | ORAL_TABLET | Freq: Three times a day (TID) | ORAL | 0 refills | Status: AC | PRN

## 2023-07-29 MED ORDER — OXYCODONE HCL 5 MG PO TABS: 5.0000 mg | ORAL_TABLET | Freq: Once | ORAL | Status: DC | PRN

## 2023-07-29 MED ORDER — CELECOXIB 200 MG PO CAPS
200.0000 mg | ORAL_CAPSULE | Freq: Once | ORAL | Status: AC
Start: 2023-07-29 — End: 2023-07-29
  Administered 2023-07-29: 200 mg via ORAL
  Filled 2023-07-29: qty 1

## 2023-07-29 MED ORDER — LACTATED RINGERS IV SOLN: INTRAVENOUS | Status: DC

## 2023-07-29 MED ORDER — ACETAMINOPHEN 500 MG PO TABS
1000.0000 mg | ORAL_TABLET | Freq: Once | ORAL | Status: AC
  Administered 2023-07-29: 1000 mg via ORAL
  Filled 2023-07-29: qty 2

## 2023-07-29 MED ORDER — LIDOCAINE 2% (20 MG/ML) 5 ML SYRINGE
INTRAMUSCULAR | Status: DC | PRN
  Administered 2023-07-29: 100 mg via INTRAVENOUS

## 2023-07-29 MED ORDER — MIDAZOLAM HCL 2 MG/2ML IJ SOLN
INTRAMUSCULAR | Status: AC
  Filled 2023-07-29: qty 2

## 2023-07-29 MED ORDER — LIDOCAINE HCL (PF) 1 % IJ SOLN
INTRAMUSCULAR | Status: DC | PRN
  Administered 2023-07-29: 20 mL

## 2023-07-29 MED ORDER — DEXAMETHASONE SODIUM PHOSPHATE 10 MG/ML IJ SOLN
INTRAMUSCULAR | Status: DC | PRN
  Administered 2023-07-29: 10 mg via INTRAVENOUS

## 2023-07-29 MED ORDER — PROPOFOL 500 MG/50ML IV EMUL
INTRAVENOUS | Status: DC | PRN
  Administered 2023-07-29: 150 ug/kg/min via INTRAVENOUS

## 2023-07-29 MED ORDER — CHLORHEXIDINE GLUCONATE 0.12 % MT SOLN
15.0000 mL | Freq: Once | OROMUCOSAL | Status: AC
  Administered 2023-07-29: 15 mL via OROMUCOSAL
  Filled 2023-07-29: qty 15

## 2023-07-29 MED ORDER — POVIDONE-IODINE 10 % EX SWAB
2.0000 | Freq: Once | CUTANEOUS | Status: AC
  Administered 2023-07-29: 2 via TOPICAL

## 2023-07-29 MED ORDER — LIDOCAINE HCL 1 % IJ SOLN
INTRAMUSCULAR | Status: AC
  Filled 2023-07-29: qty 20

## 2023-07-29 MED ORDER — ONDANSETRON HCL 4 MG/2ML IJ SOLN
INTRAMUSCULAR | Status: DC | PRN
  Administered 2023-07-29: 4 mg via INTRAVENOUS

## 2023-07-29 MED ORDER — OXYCODONE HCL 5 MG/5ML PO SOLN: 5.0000 mg | Freq: Once | ORAL | Status: DC | PRN

## 2023-07-29 MED ORDER — MEPERIDINE HCL 25 MG/ML IJ SOLN: 6.2500 mg | INTRAMUSCULAR | Status: DC | PRN

## 2023-07-29 SURGICAL SUPPLY — 13 items
ABLATOR SURESOUND NOVASURE (ABLATOR) ×2 IMPLANT
CATH ROBINSON RED A/P 16FR (CATHETERS) IMPLANT
COVER MAYO STAND STRL (DRAPES) ×2 IMPLANT
DEVICE MYOSURE REACH (MISCELLANEOUS) ×1 IMPLANT
DILATOR CANAL MILEX (MISCELLANEOUS) IMPLANT
GLOVE BIO SURGEON STRL SZ 6 (GLOVE) ×2 IMPLANT
GLOVE SURG UNDER POLY LF SZ7 (GLOVE) ×2 IMPLANT
GOWN STRL REUS W/ TWL LRG LVL3 (GOWN DISPOSABLE) ×2 IMPLANT
KIT PROCEDURE FLUENT (KITS) ×2 IMPLANT
PACK VAGINAL MINOR WOMEN LF (CUSTOM PROCEDURE TRAY) ×2 IMPLANT
PAD OB MATERNITY 11 LF (PERSONAL CARE ITEMS) ×2 IMPLANT
SEAL ROD LENS SCOPE MYOSURE (ABLATOR) ×2 IMPLANT
TOWEL GREEN STERILE FF (TOWEL DISPOSABLE) ×2 IMPLANT

## 2023-07-29 NOTE — Transfer of Care (Signed)
 Immediate Anesthesia Transfer of Care Note  Patient: Frances Perez  Procedure(s) Performed: ABLATION, ENDOMETRIUM, HYSTEROSCOPIC MYOSURE WITH RESECTION (Uterus)  Patient Location: PACU  Anesthesia Type:MAC  Level of Consciousness: awake, alert , and oriented  Airway & Oxygen Therapy: Patient Spontanous Breathing  Post-op Assessment: Report given to RN and Post -op Vital signs reviewed and stable  Post vital signs: Reviewed and stable  Last Vitals:  Vitals Value Taken Time  BP 108/69 07/29/23 16:15  Temp 36.3 C 07/29/23 16:06  Pulse 66 07/29/23 16:16  Resp 19 07/29/23 16:16  SpO2 97 % 07/29/23 16:16  Vitals shown include unfiled device data.  Last Pain:  Vitals:   07/29/23 1606  TempSrc:   PainSc: 0-No pain      Patients Stated Pain Goal: 0 (07/29/23 1307)  Complications: No notable events documented.

## 2023-07-29 NOTE — Anesthesia Preprocedure Evaluation (Addendum)
 Anesthesia Evaluation  Patient identified by MRN, date of birth, ID band Patient awake    Reviewed: Allergy & Precautions, NPO status , Patient's Chart, lab work & pertinent test results  History of Anesthesia Complications Negative for: history of anesthetic complications  Airway Mallampati: II  TM Distance: >3 FB Neck ROM: Full    Dental  (+) Dental Advisory Given   Pulmonary neg pulmonary ROS   breath sounds clear to auscultation       Cardiovascular negative cardio ROS Normal cardiovascular exam Rhythm:Regular Rate:Normal     Neuro/Psych negative neurological ROS     GI/Hepatic negative GI ROS, Neg liver ROS,,,  Endo/Other  negative endocrine ROS    Renal/GU negative Renal ROS     Musculoskeletal   Abdominal   Peds  Hematology Hb 11.7, plt 347k   Anesthesia Other Findings   Reproductive/Obstetrics OCPs LMP 07/01/2023                              Anesthesia Physical Anesthesia Plan  ASA: 1  Anesthesia Plan: General   Post-op Pain Management: Tylenol  PO (pre-op)* and Minimal or no pain anticipated   Induction: Intravenous  PONV Risk Score and Plan: 3 and Ondansetron , Dexamethasone  and Scopolamine patch - Pre-op  Airway Management Planned: LMA  Additional Equipment: None  Intra-op Plan:   Post-operative Plan:   Informed Consent: I have reviewed the patients History and Physical, chart, labs and discussed the procedure including the risks, benefits and alternatives for the proposed anesthesia with the patient or authorized representative who has indicated his/her understanding and acceptance.     Dental advisory given  Plan Discussed with: CRNA and Surgeon  Anesthesia Plan Comments: (Addendum: Dr Cleatus prefers MAC, pt agrees)         Anesthesia Quick Evaluation

## 2023-07-29 NOTE — Anesthesia Postprocedure Evaluation (Signed)
 Anesthesia Post Note  Patient: Frances Perez  Procedure(s) Performed: ABLATION, ENDOMETRIUM, HYSTEROSCOPIC MYOSURE WITH RESECTION (Uterus)     Patient location during evaluation: PACU Anesthesia Type: MAC Level of consciousness: awake and alert, oriented and patient cooperative Pain management: pain level controlled Vital Signs Assessment: post-procedure vital signs reviewed and stable Respiratory status: spontaneous breathing, nonlabored ventilation and respiratory function stable Cardiovascular status: stable and blood pressure returned to baseline Postop Assessment: no apparent nausea or vomiting, adequate PO intake and able to ambulate Anesthetic complications: no   No notable events documented.  Last Vitals:  Vitals:   07/29/23 1615 07/29/23 1630  BP: 108/69 118/78  Pulse: 65 (!) 59  Resp: 19 20  Temp:  (!) 36.3 C  SpO2: 97% 99%    Last Pain:  Vitals:   07/29/23 1630  TempSrc:   PainSc: 0-No pain                 Kden Wagster,E. Nikko Goldwire

## 2023-07-29 NOTE — OR Nursing (Signed)
 LENGTH: 4.5 POWER 99 WIDTH: 4 TIME 52 SECONDS

## 2023-07-29 NOTE — H&P (Signed)
 [facility] Gyne[facility] Attending History and Physical  Frances Perez is a 53 y.o. [id number] who presented to [facility] today for D&C/H and endometrial ablation.   She had sonata  and fibroid removal on 1/8. Pathology was benign including curettings that were negative for hyperplasia or malignancy.    Since her procedure, her periods have been heavier although now regular. She is passing large clots, about the size of a baseball. She did use the aygestin  which did provide some relief and helped her bleeding.     Past Medical History:  Diagnosis Date   Cancer (HCC)    skin-upper left arm; anterior chest   Fibroid    uterus   H/O bacterial sinusitis    x several   History of COVID-19 01/2020   mild cold symptoms x 3 days all symptoms resolved   Pneumonia    x 1 as a child   Past Surgical History:  Procedure Laterality Date   c sections     2003 and 2006   DILATATION & CURETTAGE/HYSTEROSCOPY WITH MYOSURE N/A 01/16/2023   Procedure: DILATATION & CURETTAGE/HYSTEROSCOPY WITH MYOSURE;  Surgeon: Cleatus Moccasin, MD;  Location: [facility];  Service: Gynecology;  Laterality: N/A;   WISDOM TOOTH EXTRACTION     OB History  Gravida Para Term Preterm AB Living  2 2 2   2   SAB IAB Ectopic Multiple Live Births          # Outcome Date GA Lbr Len/2nd Weight Sex Type Anes PTL Lv  2 Term           1 Term           Patient denies any other pertinent gynecologic issues.  No current facility-administered medications on file prior to encounter.   Current Outpatient Medications on File Prior to Encounter  Medication Sig Dispense Refill   norethindrone  (AYGESTIN ) 5 MG tablet Take 1 tablet (5 mg total) by mouth daily. Take for 7-10 days for persistent bleeding (Patient not taking: Reported on 07/25/2023) 30 tablet 1   tranexamic acid  (LYSTEDA ) 650 MG TABS tablet Take 2 tablets (1,300 mg total) by mouth 3 (three) times daily. Take during menses for a maximum of five days  (Patient not taking: Reported on 02/07/2023) 30 tablet 2   No Known Allergies  Social History:   reports that she has never smoked. She has never used smokeless tobacco. She reports current alcohol use. She reports that she does not use drugs. Family History  Problem Relation Age of Onset   Colon polyps Mother    Healthy Mother    Melanoma Father    Esophageal cancer Neg Hx    Rectal cancer Neg Hx    Stomach cancer Neg Hx    Colon cancer Neg Hx     Review of Systems: Pertinent items noted in HPI and remainder of comprehensive ROS otherwise negative.  PHYSICAL EXAM: Blood pressure (!) 141/76, pulse 73, temperature 98.3 F (36.8 C), temperature source Oral, resp. rate 17, height 5' 8 (1.727 m), weight 72.6 kg, last menstrual period 07/01/2023, SpO2 98%. CONSTITUTIONAL: Well-developed, well-nourished female in no acute distress.  HENT:  Normocephalic, atraumatic, External right and left ear normal. Oropharynx is clear and moist EYES: Conjunctivae and EOM are normal. Pupils are equal, round, and reactive to light. No scleral icterus.  NECK: Normal range of motion, supple, no masses SKIN: Skin is warm and dry. No rash noted. Not diaphoretic. No erythema. No pallor. NEUROLOGIC: Alert and oriented  to person, place, and time. Normal reflexes, muscle tone coordination. No cranial nerve deficit noted. PSYCHIATRIC: Normal mood and affect. Normal behavior. Normal judgment and thought content. CARDIOVASCULAR: Normal heart rate noted, regular rhythm RESPIRATORY: Effort and breath sounds normal, no problems with respiration noted ABDOMEN: Soft, nontender, nondistended. PELVIC: Not examined MUSCULOSKELETAL: Normal range of motion. No tenderness.  No cyanosis, clubbing, or edema.  2+ distal pulses.  Labs: Results for orders placed or performed during the hospital encounter of 07/29/23 (from the past 2 weeks)  CBC per protocol   Collection Time: 07/29/23  1:27 PM  Result Value Ref Range   WBC  6.1 4.0 - 10.5 K/uL   RBC 4.52 3.87 - 5.11 MIL/uL   Hemoglobin 11.7 (L) 12.0 - 15.0 g/dL   HCT 62.4 63.9 - 53.9 %   MCV 83.0 80.0 - 100.0 fL   MCH 25.9 (L) 26.0 - 34.0 pg   MCHC 31.2 30.0 - 36.0 g/dL   RDW 83.5 (H) 88.4 - 84.4 %   Platelets 347 150 - 400 K/uL   nRBC 0.0 0.0 - 0.2 %  Type and screen   Collection Time: 07/29/23  1:27 PM  Result Value Ref Range   ABO/RH(D) PENDING    Antibody Screen PENDING    Sample Expiration      08/01/2023,2359 Performed at [facility], [address] [place], [place] [address]     Imaging Studies: No results found.  Assessment: Active Problems:   Dysfunctional uterine bleeding   Plan: Previously options available - expectant management, hormonal therapy, TXA, endometrial ablation and hysterectomy. We discussed the risks and benefits of each and she wanted to do endometrial ablation. She had recent endometrial sampling on 1/8 so no need to repeat. Consent reviewed and signed.  Restrictions reviewed.  All questions answered.    Vina Solian, MD, FACOG Obstetrician & Gynecologist, [place] for [facility], [facility]

## 2023-07-29 NOTE — Op Note (Signed)
 Frances Perez PROCEDURE DATE: 07/29/2023   PREOPERATIVE DIAGNOSIS:  Menorrhagia  POSTOPERATIVE DIAGNOSIS:  Same  PROCEDURE:  D&C, hysteroscopy, Novasure endometrial ablation, myosure resection of fibroids  SURGEON:  Dr. Vina Solian  ASSISTANT:  None  ANESTHESIA: IV sedation, 1% lidocaine  paracervical block.   COMPLICATIONS:  None immediate.  ESTIMATED BLOOD LOSS:  10 ml.  FLUIDS: 900 ml LR.  Fluid Deficit: 875 cc (most on the floor)  URINE OUTPUT:  None - not drained  INDICATIONS: 53 y.o. [id number] with heavy menstrual bleeding    FINDINGS:  NEFG, normal appearing cervix. Normal proliferative endometrial cavity with small right side wall fibroid and fibroid protruding into the cavity - this was anterior and at the fundus. Ostia visualized bilaterally. Homogenous char noted after procedure.   TECHNIQUE:  The patient was taken to the operating room where LMA anesthesia was obtained without difficulty.  She was then placed in the dorsal lithotomy position and prepared and draped in sterile fashion.  After an adequate timeout was performed, a weighted speculum was then placed in the patient's vagina, and the anterior lip of cervix grasped with the single-tooth tenaculum. A paracervical block was done with 1% lidocaine  with 10 cc at 4 and 8 oclock.  The cervix was dilated with Fredirick dilators. Hysteroscope inserted and cavity had aforementioned findings. Myosure REACH was used to remove both of the portion of the fibroids protruding into the cavity. Uterine sound was 9 cm. Cervical length was 4.5 cm giving cavity length of 4.5. Novasure device inserted and cavity width found to be 4.0. Cavity assessment done and assured. Device activated. Power was 99 and length of ablation was 0:52. Device removed once completed and hysteroscope inserted and homogenous char noted. All instruments removed and procedure was ended.   The patient will be discharged to home as per PACU criteria.  Routine  postoperative instructions given.  Vina Solian, MD Attending Obstetrician & Gynecologist, [facility] for [facility], [facility]

## 2023-07-30 ENCOUNTER — Encounter (HOSPITAL_COMMUNITY): Payer: Self-pay | Admitting: Obstetrics and Gynecology

## 2023-07-30 LAB — SURGICAL PATHOLOGY

## 2023-07-31 ENCOUNTER — Ambulatory Visit: Payer: Self-pay | Admitting: Obstetrics and Gynecology
